# Patient Record
Sex: Male | Born: 1965 | Race: White
Health system: Northeastern US, Academic
[De-identification: ages and names within clinical notes are randomized; demographics above are authoritative.]

## PROBLEM LIST (undated history)

## (undated) DIAGNOSIS — R739 Hyperglycemia, unspecified: Secondary | ICD-10-CM

## (undated) DIAGNOSIS — IMO0001 Reserved for inherently not codable concepts without codable children: Secondary | ICD-10-CM

## (undated) DIAGNOSIS — F32A Depression, unspecified: Secondary | ICD-10-CM

## (undated) DIAGNOSIS — Z87898 Personal history of other specified conditions: Secondary | ICD-10-CM

## (undated) DIAGNOSIS — E785 Hyperlipidemia, unspecified: Secondary | ICD-10-CM

## (undated) DIAGNOSIS — F1020 Alcohol dependence, uncomplicated: Secondary | ICD-10-CM

## (undated) DIAGNOSIS — K219 Gastro-esophageal reflux disease without esophagitis: Secondary | ICD-10-CM

## (undated) DIAGNOSIS — B009 Herpesviral infection, unspecified: Secondary | ICD-10-CM

## (undated) DIAGNOSIS — F419 Anxiety disorder, unspecified: Secondary | ICD-10-CM

## (undated) DIAGNOSIS — F329 Major depressive disorder, single episode, unspecified: Secondary | ICD-10-CM

## (undated) DIAGNOSIS — N419 Inflammatory disease of prostate, unspecified: Secondary | ICD-10-CM

## (undated) DIAGNOSIS — I10 Essential (primary) hypertension: Secondary | ICD-10-CM

## (undated) HISTORY — DX: Depression, unspecified: F32.A

## (undated) HISTORY — DX: Major depressive disorder, single episode, unspecified: F32.9

## (undated) HISTORY — DX: Essential (primary) hypertension: I10

## (undated) HISTORY — DX: Hyperlipidemia, unspecified: E78.5

## (undated) HISTORY — DX: Inflammatory disease of prostate, unspecified: N41.9

## (undated) HISTORY — DX: Reserved for inherently not codable concepts without codable children: IMO0001

## (undated) HISTORY — DX: Gastro-esophageal reflux disease without esophagitis: K21.9

## (undated) HISTORY — DX: Alcohol dependence, uncomplicated: F10.20

## (undated) HISTORY — DX: Herpesviral infection, unspecified: B00.9

## (undated) HISTORY — DX: Hyperglycemia, unspecified: R73.9

## (undated) HISTORY — DX: Anxiety disorder, unspecified: F41.9

## (undated) HISTORY — PX: CHOLECYSTECTOMY: SHX55

## (undated) HISTORY — PX: COLONOSCOPY: SHX174

---
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
## 1988-05-30 HISTORY — PX: CHOLECYSTECTOMY: SHX55

## 2003-09-18 ENCOUNTER — Emergency Department (HOSPITAL_COMMUNITY): Admission: EM | Admit: 2003-09-18 | Discharge: 2003-09-18 | Payer: Self-pay | Admitting: Emergency Medicine

## 2004-05-30 HISTORY — PX: CARDIAC CATHETERIZATION: SHX172

## 2004-09-15 ENCOUNTER — Emergency Department (HOSPITAL_COMMUNITY): Admission: EM | Admit: 2004-09-15 | Discharge: 2004-09-15 | Payer: Self-pay | Admitting: Emergency Medicine

## 2004-09-16 ENCOUNTER — Inpatient Hospital Stay (HOSPITAL_COMMUNITY): Admission: EM | Admit: 2004-09-16 | Discharge: 2004-09-18 | Payer: Self-pay | Admitting: Emergency Medicine

## 2005-11-27 HISTORY — PX: DOPPLER ECHOCARDIOGRAPHY: SHX263

## 2007-05-23 ENCOUNTER — Encounter: Admission: RE | Admit: 2007-05-23 | Discharge: 2007-05-23 | Payer: Self-pay | Admitting: Neurology

## 2007-05-31 HISTORY — PX: ORIF FINGER FRACTURE: SHX2122

## 2007-08-12 ENCOUNTER — Encounter: Admission: RE | Admit: 2007-08-12 | Discharge: 2007-08-12 | Payer: Self-pay | Admitting: Orthopedic Surgery

## 2007-08-21 ENCOUNTER — Ambulatory Visit (HOSPITAL_BASED_OUTPATIENT_CLINIC_OR_DEPARTMENT_OTHER): Admission: RE | Admit: 2007-08-21 | Discharge: 2007-08-21 | Payer: Self-pay | Admitting: Orthopedic Surgery

## 2008-04-22 ENCOUNTER — Emergency Department (HOSPITAL_COMMUNITY): Admission: EM | Admit: 2008-04-22 | Discharge: 2008-04-22 | Payer: Self-pay | Admitting: Emergency Medicine

## 2009-05-04 ENCOUNTER — Emergency Department (HOSPITAL_COMMUNITY): Admission: EM | Admit: 2009-05-04 | Discharge: 2009-05-04 | Payer: Self-pay | Admitting: Emergency Medicine

## 2009-11-05 ENCOUNTER — Emergency Department (HOSPITAL_COMMUNITY): Admission: EM | Admit: 2009-11-05 | Discharge: 2009-11-05 | Payer: Self-pay | Admitting: Emergency Medicine

## 2009-12-24 ENCOUNTER — Emergency Department (HOSPITAL_COMMUNITY): Admission: EM | Admit: 2009-12-24 | Discharge: 2009-12-24 | Payer: Self-pay | Admitting: Emergency Medicine

## 2010-01-07 ENCOUNTER — Encounter
Admission: RE | Admit: 2010-01-07 | Discharge: 2010-01-07 | Payer: Self-pay | Source: Home / Self Care | Admitting: Emergency Medicine

## 2010-08-11 IMAGING — US US SCROTUM
1 series · 14 of 25 positions shown · non-contrast
Comparison: None.

CLINICAL DATA: Scrotal pain

SCROTAL ULTRASOUND
DOPPLER ULTRASOUND OF THE TESTICLES
TECHNIQUE: Complete ultrasound examination of the testicles,
epididymis, and other scrotal structures was performed.  Color and
spectral Doppler ultrasound were also utilized to evaluate blood
flow to the testicles.

[Series 1: us scrotum · 0.08mm/px · 14 of 62 slices shown]
[im 1/62]
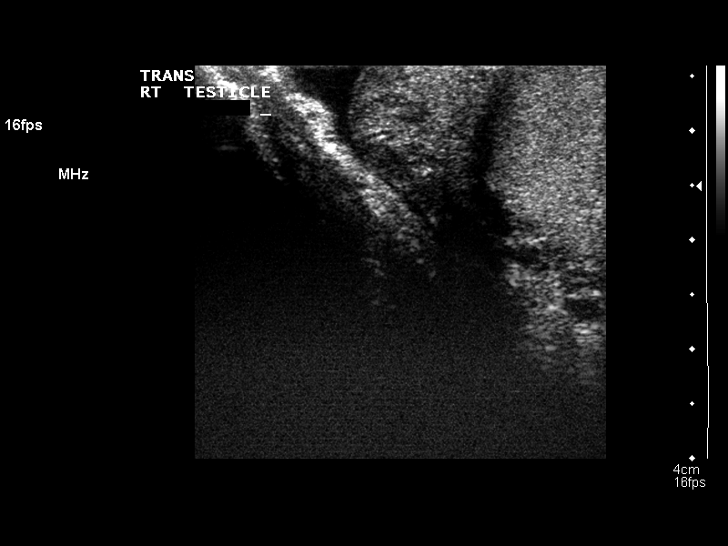
[im 6/62]
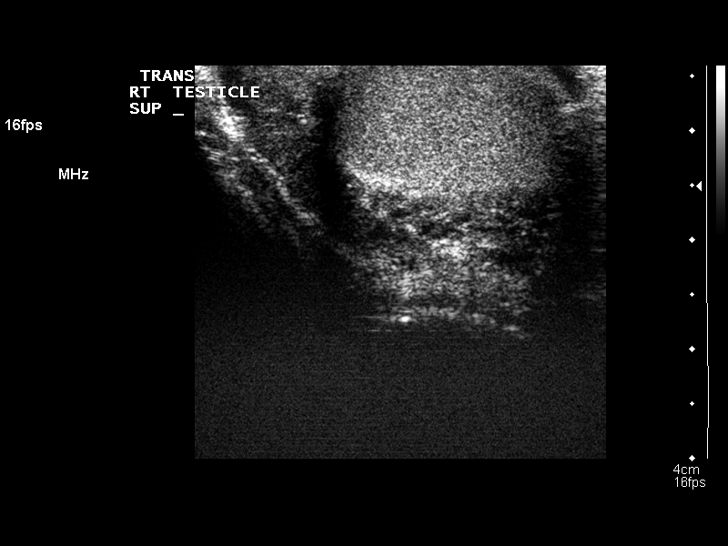
[im 11/62]
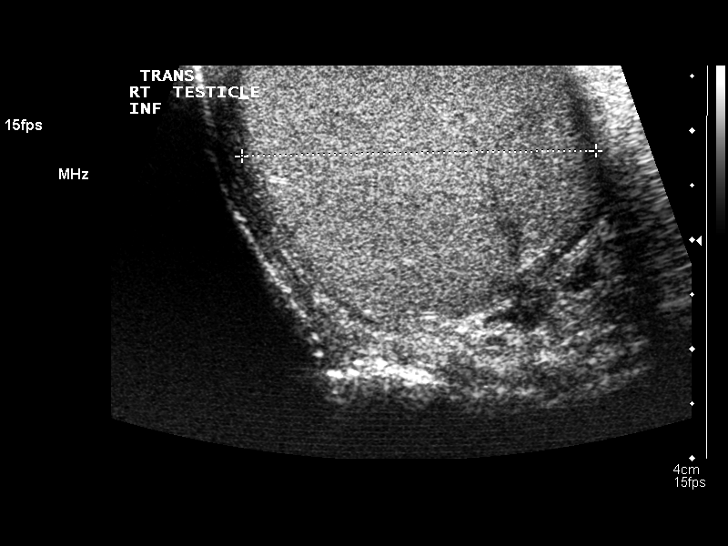
[im 16/62]
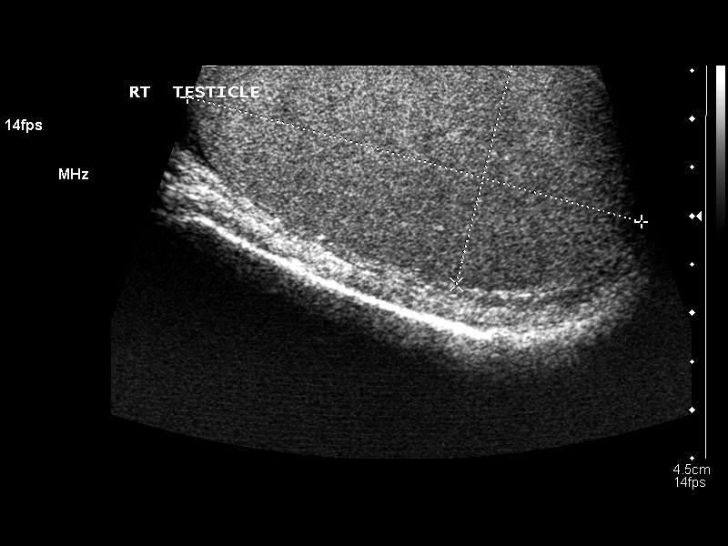
[im 21/62]
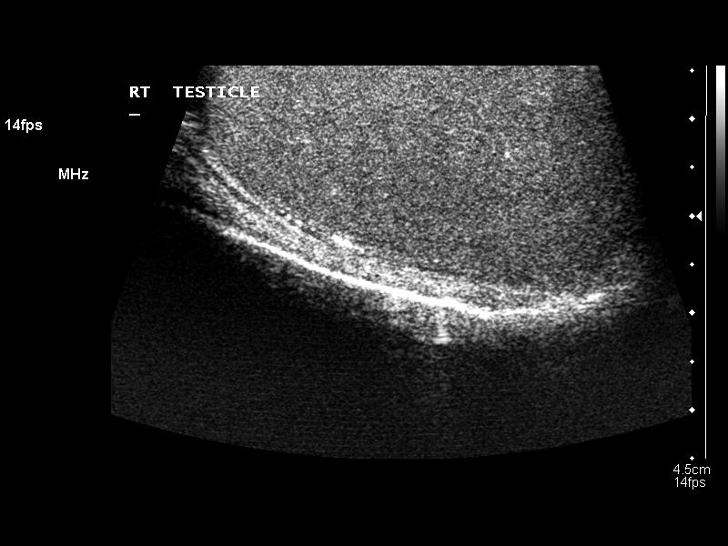
[im 23/62]
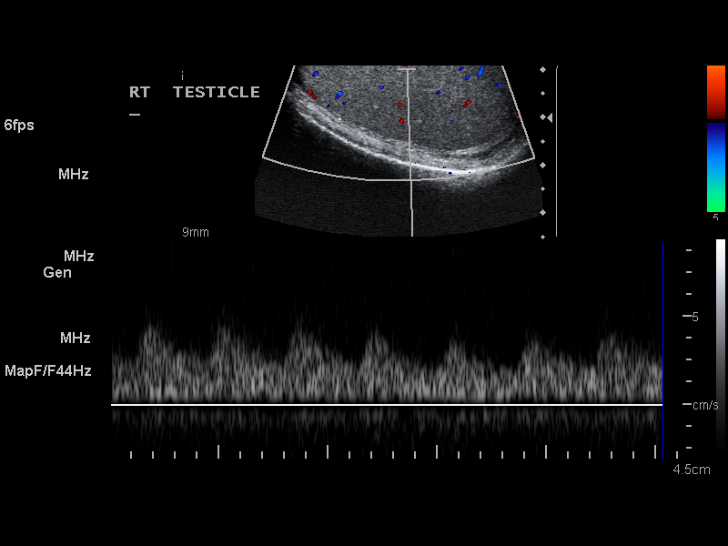
[im 28/62]
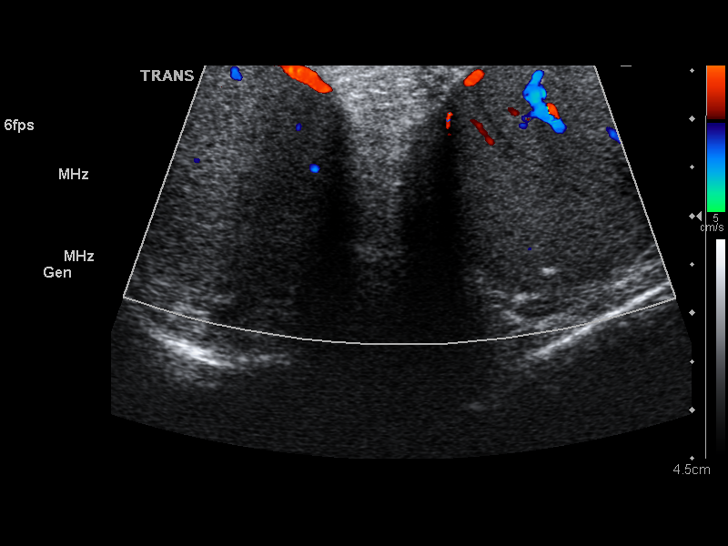
[im 34/62]
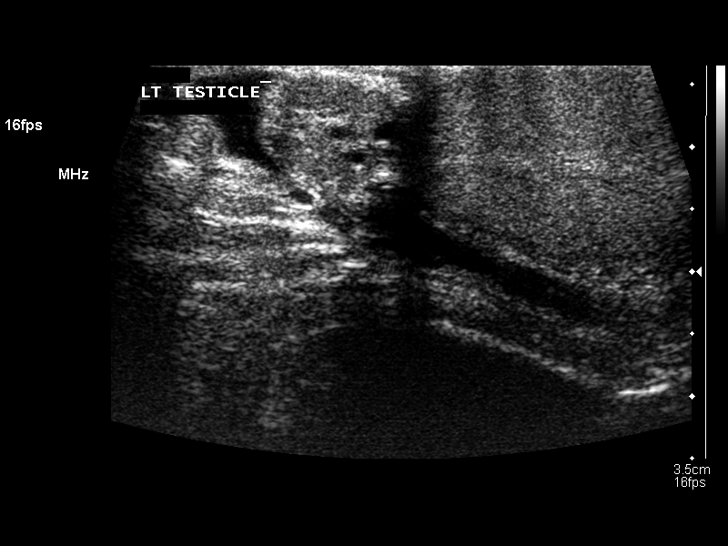
[im 39/62]
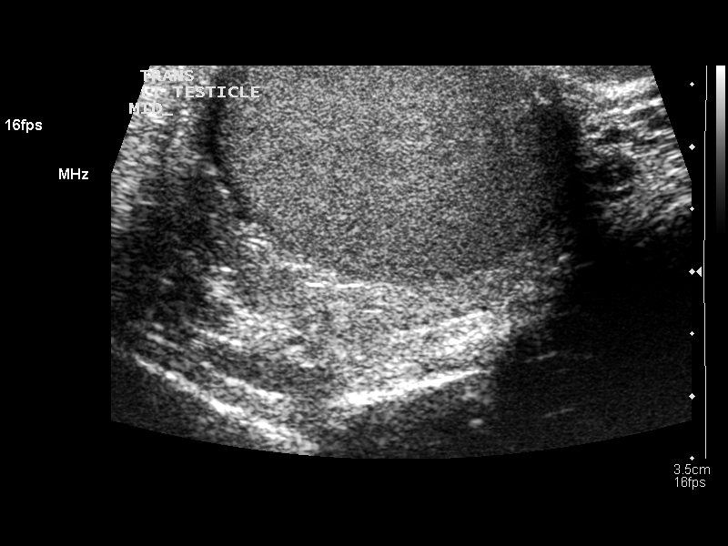
[im 41/62]
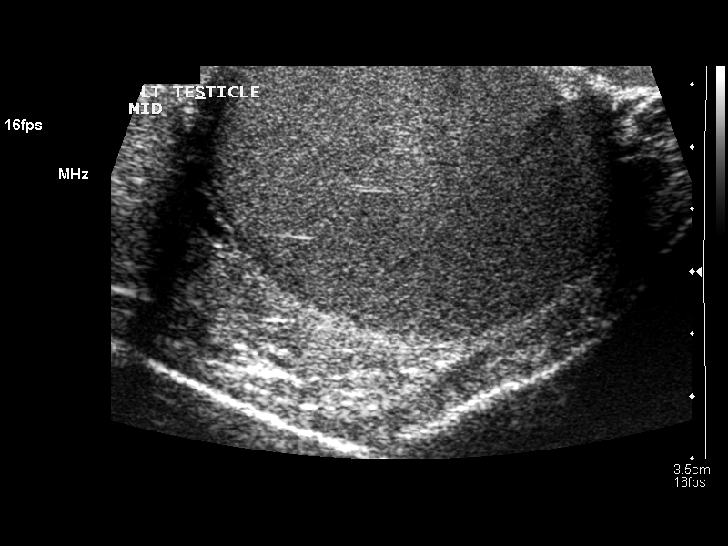
[im 46/62]
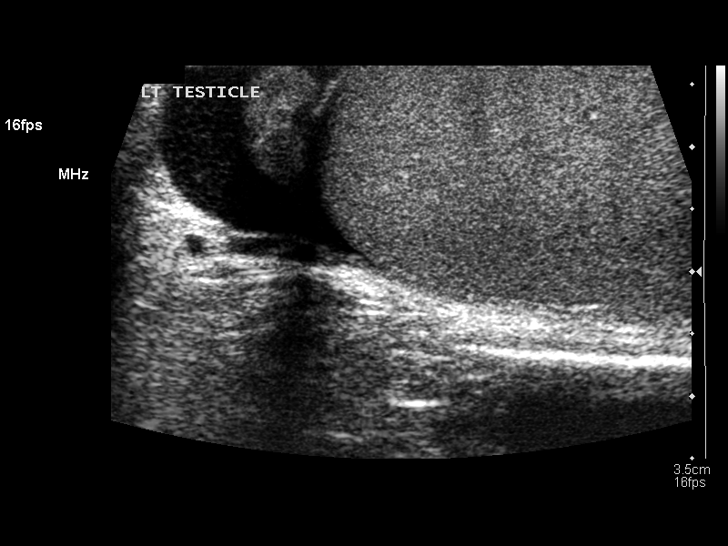
[im 51/62]
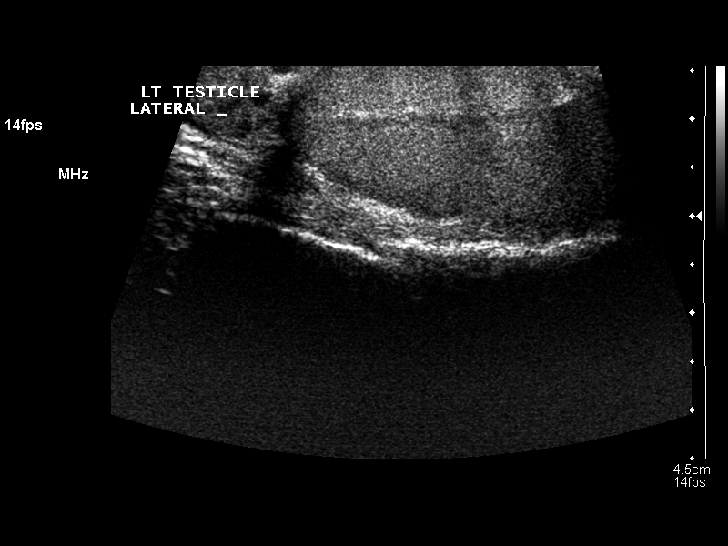
[im 56/62]
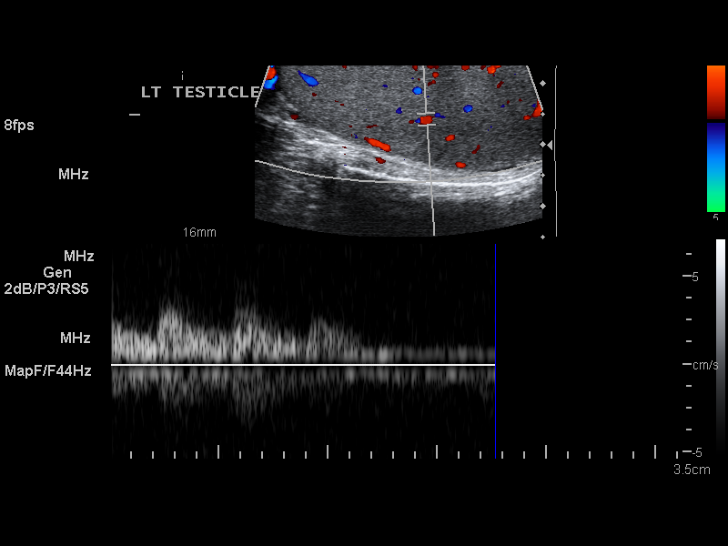
[im 62/62]
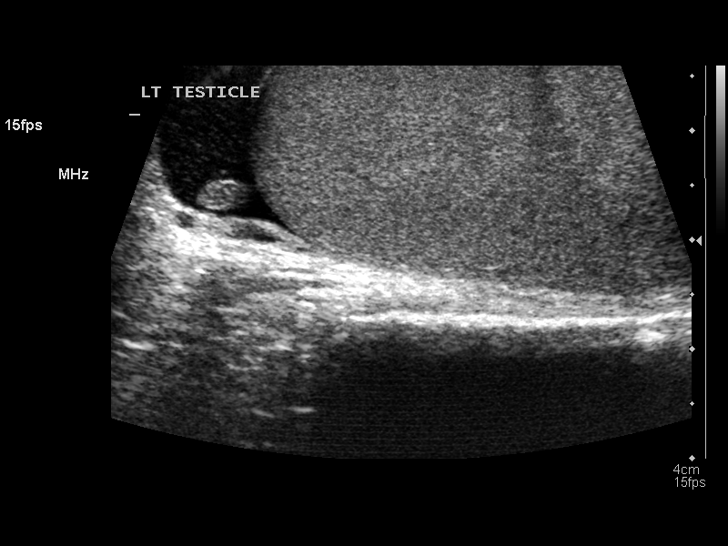

[14 of 25 positions shown; findings below may reference images not displayed]

FINDINGS: The testicles are normal in size and in echogenicity.
The right testicle measures 4.9 x 2.5 x 3.2 cm.  The left testicle
measures 4.9 x 2.4 x 3.1 cm. A few scattered calcifications are
noted bilaterally.  There is blood flow noted to both testicles
with arterial and venous waveforms present.  The right epididymis
is normal.  A small 2 mm left epididymal cyst is noted.  No
hydrocele or varicocele is seen.
IMPRESSION: No intratesticular abnormality.  There is blood flow to both
testicles.  2 mm left epididymal cyst is noted.

## 2010-08-14 LAB — URINALYSIS, ROUTINE W REFLEX MICROSCOPIC
Bilirubin Urine: NEGATIVE
Hgb urine dipstick: NEGATIVE
Ketones, ur: NEGATIVE mg/dL
Nitrite: NEGATIVE
Specific Gravity, Urine: 1.015 (ref 1.005–1.030)
Urobilinogen, UA: 0.2 mg/dL (ref 0.0–1.0)
pH: 7.5 (ref 5.0–8.0)

## 2010-08-16 LAB — HEPATIC FUNCTION PANEL
AST: 35 U/L (ref 0–37)
Albumin: 4.5 g/dL (ref 3.5–5.2)
Alkaline Phosphatase: 59 U/L (ref 39–117)
Indirect Bilirubin: 0.9 mg/dL (ref 0.3–0.9)
Total Protein: 6.9 g/dL (ref 6.0–8.3)

## 2010-08-16 LAB — CBC
HCT: 41.7 % (ref 39.0–52.0)
Hemoglobin: 14.6 g/dL (ref 13.0–17.0)
MCHC: 35 g/dL (ref 30.0–36.0)
Platelets: 167 10*3/uL (ref 150–400)
RDW: 12.8 % (ref 11.5–15.5)

## 2010-08-16 LAB — POCT I-STAT, CHEM 8
Chloride: 105 mEq/L (ref 96–112)
HCT: 43 % (ref 39.0–52.0)
Hemoglobin: 14.6 g/dL (ref 13.0–17.0)
Potassium: 4 mEq/L (ref 3.5–5.1)
Sodium: 141 mEq/L (ref 135–145)

## 2010-08-16 LAB — DIFFERENTIAL
Basophils Relative: 0 % (ref 0–1)
Eosinophils Absolute: 0 10*3/uL (ref 0.0–0.7)
Eosinophils Relative: 1 % (ref 0–5)
Neutrophils Relative %: 66 % (ref 43–77)

## 2010-08-16 LAB — POCT CARDIAC MARKERS: Troponin i, poc: <0.05 ng/mL (ref 0.00–0.09)

## 2010-08-31 LAB — DIFFERENTIAL
Basophils Absolute: 0 10*3/uL (ref 0.0–0.1)
Basophils Relative: 0 % (ref 0–1)
Eosinophils Relative: 1 % (ref 0–5)
Lymphs Abs: 1 10*3/uL (ref 0.7–4.0)
Monocytes Relative: 8 % (ref 3–12)

## 2010-08-31 LAB — CBC
HCT: 45.2 % (ref 39.0–52.0)
Hemoglobin: 15.9 g/dL (ref 13.0–17.0)
MCHC: 35.2 g/dL (ref 30.0–36.0)
MCV: 90.9 fL (ref 78.0–100.0)
Platelets: 209 10*3/uL (ref 150–400)

## 2010-08-31 LAB — POCT I-STAT, CHEM 8
BUN: 14 mg/dL (ref 6–23)
HCT: 46 % (ref 39.0–52.0)
TCO2: 31 mmol/L (ref 0–100)

## 2010-08-31 LAB — BASIC METABOLIC PANEL
CO2: 30 mEq/L (ref 19–32)
Calcium: 10.1 mg/dL (ref 8.4–10.5)
Chloride: 102 mEq/L (ref 96–112)
GFR calc Af Amer: >60 mL/min (ref >60)
GFR calc non Af Amer: >60 mL/min (ref >60)
Potassium: 3.8 mEq/L (ref 3.5–5.1)
Sodium: 140 mEq/L (ref 135–145)

## 2010-08-31 LAB — POCT CARDIAC MARKERS: Troponin i, poc: <0.05 ng/mL (ref 0.00–0.09)

## 2010-10-12 NOTE — Op Note (Signed)
NAMESEYDOU, HEARNS             ACCOUNT NO.:  [id number]   MEDICAL RECORD NO.:  [id number]          PATIENT TYPE:  AMB   LOCATION:  DSC                          FACILITY:  [facility]   PHYSICIAN:  Katy Fitch. Sypher, M.D. DATE OF BIRTH:  1966-04-08   DATE OF PROCEDURE:  08/21/2007  DATE OF DISCHARGE:                               OPERATIVE REPORT   PREOPERATIVE DIAGNOSIS:  Unstable displaced proximal metaphysis fracture  right ring finger metacarpal.   POSTOPERATIVE DIAGNOSIS:  Unstable displaced proximal metaphysis  fracture right ring finger metacarpal.   OPERATIONS:  Open reduction internal fixation of right ring finger  metacarpal utilizing 3 x 2 mm lag screws.   OPERATION SURGEON:  Josephine Igo, MD   ASSISTANT:  Annye Rusk PA-C   ANESTHESIA:  General by LMA.   SUPERVISING ANESTHESIOLOGIST:  Dr. Jairo Ben.   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   He had sustained an injury over the past weekend and was seen in our  office on Monday, August 20, 2007.  He was noted have an unstable  displaced fracture that was displaced proximally and with significant  apex dorsal angulation.  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  After  hearing the potential risks and benefits of each choice he elected to  proceed with open reduction internal fixation with lag screws.   The goal of this technique is to obtain and maintain an anatomic  reduction while he heals and to allow early active range of motion  exercises.   Mr. Kistler also has a rupture of his common extensor origin on the  left and was contemplating  reconstruction of the left common extensor  origin in the near term as well.   After informed consent he is brought to the operating room at this time.   PROCEDURE:  Fahim Kats was brought to the operating room and placed  in the supine position on the operating room table.  Following the  induction of general anesthesia by LMA technique the right arm was  prepped with Betadine soap solution and sterilely draped.  One gram of  Ancef was administered as an IV prophylactic antibiotic.  Upon  exsanguination of the right arm with Esmarch bandage the arterial  tourniquet was inflated to 230 mmHg.  The procedure commenced with a  longitudinal incision directly over the metacarpal base.  The  subcutaneous tissues were carefully divided taking care to identify and  protect the carpometacarpal joint capsule.  The periosteum was elevated  over the fracture site.  The fracture site was opened in shotgun style  and a micro curette used to remove clot and periosteal debris. The  fracture was then reduced anatomically while three 2 mm ASIF screws were  placed in lag  technique.  The screw lengths were 11 mm, 9-1/2 mm and 9  mm.  The central screw was custom tailored to a 9-1/2-mm length using  fluoroscopic control.   Anatomic reduction of the fracture was achieved.  AP, lateral, and  oblique C-arm images confirmed reduction of the fracture anatomically  and correct screw length.   The wound was then irrigated followed by repair of the muscle and  periosteum with mattress suture of 4-0 Vicryl followed by repair of the  skin with subdermal sutures of 4-0 Vicryl and intradermal 3-0 Prolene.   Mr. Skillman was placed in a compressive dressing with his long, ring  and small fingers in safe position.   For aftercare he is provided prescriptions for Percocet 5 mg, one to two  tablets p.o. q.4-6h. p.r.n. pain, 30 tablets without refill.   He will continue with Keflex 500 mg one p.o. q.8h. x4 days  as a  prophylactic antibiotic.      Katy Fitch Sypher, M.D.  Electronically Signed     RVS/MEDQ  D:  08/21/2007  T:  08/21/2007  Job:  [id number]   cc:   Katy Fitch. Sypher, M.D.

## 2010-10-15 NOTE — H&P (Signed)
NAMEMORAD, TAL             ACCOUNT NO.:  [id number]   MEDICAL RECORD NO.:  [id number]          PATIENT TYPE:  INP   LOCATION:                               FACILITY:  [facility]   PHYSICIAN:  Madaline Savage, M.D.DATE OF BIRTH:  02-02-1966   DATE OF ADMISSION:  09/16/2004  DATE OF DISCHARGE:                                HISTORY & PHYSICAL   CHIEF COMPLAINT:  Chest pain.   HISTORY OF PRESENT ILLNESS:  Patient is a 45 year old male with a history of  a cold.  He went to the urgent care on 09/15/2004.  His EKG was normal.  He  was sent to [facility] Emergency Room and his troponins were negative x2  and he was sent home.  Last evening, while watching the [place] game on TV  he developed substernal chest pain like someone sitting on my chest.  He  had some associated diaphoresis and apparent syncope.  There was a  Cardiolite done that was abnormal, the day of admission, with some inferior  thinning.  He was seen in the office today for further evaluation. He did  have a catheterization 10 years ago, that was apparently normal in [place].  He has a history of hyperlipidemia which is not treated and a history of  hypertension.  He has had panic attacks in the past and is treated with  Celexa.   CURRENT MEDICATIONS:  1.  HCTZ 25 mg a day.  2.  Celexa 20 mg a day.   ALLERGIES:  He has no known drug allergies.   SOCIAL HISTORY:  He is a remote smoker.  He is married.  He works as an  Personnel officer.  He has no children.   FAMILY HISTORY:  Remarkable for coronary disease.  His father had a bypass  at age 69.   REVIEW OF SYSTEMS:  Remarkable for taking several over-the-counter  medications for URI.  He denies any palpitations. He has not had fever or  hemoptysis.  He has no radiation of his pain to his jaw or his arms.   PHYSICAL EXAMINATION:  VITAL SIGNS:  Blood pressure 114/80, pulse 88,  respirations 16.  GENERAL:  He is a well-developed, well-nourished male in no acute  distress.  HEENT:  Normocephalic.  Extraocular movements are intact.  Sclerae are  anicteric.  Lids and conjunctivae are within normal limits.  NECK:  Without bruits, without JVD.  CHEST:  Clear to auscultation and percussion.  CARDIAC EXAM:  Reveals regular rate and rhythm without murmur, rub, or  gallop.  Normal S1-S2.  ABDOMEN:  Nontender.  No hepatosplenomegaly.  EXTREMITIES:  Without edema.  Pulses are 2+/4.  There is calf pain or  swelling.  NEUROLOGIC EXAM:  Grossly intact.   IMPRESSION:  1.  Chest pain with an abnormal Cardiolite, rule out coronary disease.  2.  Syncope of undetermined etiology.  3.  Hypertension.  4.  Untreated lipids.  5.  Family history of coronary disease.  6.  Former smoker.  7.  Upper respiratory infection.   PLAN:  He will be admitted for diagnostic catheterization.  If this is  negative, he will need a spiral CT to rule out pulmonary embolism.  The  patient was seen by Dr. Elsie Lincoln and myself in the office the day of  admission.      Antonieta Loveless  D:  11/25/2004  T:  11/25/2004  Job:  [id number]

## 2010-10-15 NOTE — Discharge Summary (Signed)
NAMESTACEY, MAURA             ACCOUNT NO.:  [id number]   MEDICAL RECORD NO.:  [id number]          PATIENT TYPE:  INP   LOCATION:  4733                         FACILITY:  [facility]   PHYSICIAN:  Madaline Savage, M.D.DATE OF BIRTH:  Nov 27, 1965   DATE OF ADMISSION:  09/16/2004  DATE OF DISCHARGE:  09/18/2004                                 DISCHARGE SUMMARY   DISCHARGE DIAGNOSES:  1.  Chest pain, consistent with unstable angina, normal coronaries and      normal left ventricular function by catheterization this admission.  2.  Hypertension.  3.  History of hyperlipidemia.   HOSPITAL COURSE:  The patient is a 45 year old male who is admitted from the  office with chest pain and an abnormal Cardiolite as an outpatient.  He had  a CT scan on admission that showed no evidence of pulmonary embolism or  dissection.  The next day, he had a diagnostic catheterization which  revealed normal coronaries and normal LV function.  He was discharged later  that day and will follow up with Dr. Elsie Lincoln.  The patient did have multiple  risk factors for coronary disease.   DISCHARGE MEDICATIONS:  1.  HCTZ 25 mg daily.  2.  Celexa 20 mg daily.   LABORATORY DATA:  White count 4.8, hemoglobin 14.4, hematocrit 40.3,  platelets 184.  INR 1.  Sodium 139, potassium 3.8, BUN 15, creatinine 0.9.  AST is 44, ALT 46.  CK-MB and troponin's are negative x3.  Lipid profile  shows a LDL of 142, HDL of 35.  TSH 2.74.   DISPOSITION:  The patient is discharged in stable condition.  Will follow up  with Dr. Elsie Lincoln.  Will need further evaluation of his lipids and liver  function tests as an outpatient.      Antonieta Loveless  D:  11/25/2004  T:  11/25/2004  Job:  [id number]

## 2010-10-15 NOTE — Cardiovascular Report (Signed)
NAMEBREON, Jeremiah [name]             ACCOUNT NO.:  [id number]   MEDICAL RECORD NO.:  [id number]          PATIENT TYPE:  INP   LOCATION:  4733                         FACILITY:  [facility]   PHYSICIAN:  Madaline Savage, M.D.DATE OF BIRTH:  [birth year]   DATE OF PROCEDURE:  09/17/2004  DATE OF DISCHARGE:  09/18/2004                              CARDIAC CATHETERIZATION   PROCEDURES PERFORMED:  1.  Selective coronary angiography by Judkins technique.  2.  Retrograde left heart catheterization.  3.  Left ventricular angiography.  4.  Right femoral artery angiogram with successful AngioSeal closure.   COMPLICATIONS:  None.   ENTRY SITE:  Right femoral.   DYE USED:  Omnipaque.   PATIENT PROFILE:  The patient is a pleasant 45 year old white married  gentleman, who had an episode of syncope on the day prior to admission with  some substernal chest pain. A Cardiolite stress test was performed at the  [facility] yesterday, and had equivocal results  with some suspicion of either attenuation are inferior wall ischemia of the  inferior wall. The patient was admitted and has had negative cardiac enzyme  determinations; now presents to the catheterization lab for cardiac  catheterization, (especially since the patient had fleeting chest pains last  evening).  He also has multiform PVCs noted. The patient enters the  catheterization  lab with stable vital signs electively.   RESULTS:   PRESSURES:  1.  Left ventricular pressure was 130/6 with an end-diastolic pressure of      16.  2.  Central aortic pressure 130/75 with a mean of 100.  3.  No aortic valve gradient by pullback technique.   ANGIOGRAPHIC RESULTS:  The patient's coronary arteries were angiographically  patent.  LEFT MAIN:  The left main was normal.  LEFT ANTERIOR DESCENDING ARTERY  The LAD coursed to cardiac apex giving  rise to two diagonal branches, both of which were normal, as was the LAD.  CIRCUMFLEX:  The circumflex was comprised of a large obtuse marginal branch,  which trifurcated and showed no lesions. The proximal mid and distal  circumflex was normal.  RIGHT CORONARY ARTERY:  The right coronary artery was widely patent  throughout and was a large and dominant vessel of the circulation, with no  lesions.   VENTRICULOGRAPHY:  The left ventricle showed good contractility of all wall  segments, without any wall motion abnormalities. Ejection fraction was 60-  65%. No evidence of LV thrombus or mitral prolapse.   ABDOMINAL AORTOGRAM:  Right femoral angiography was followed by successful  AngioSeal closure of the right femoral artery, with good hemostasis.   FINAL DIAGNOSIS:  1.  Angiographically patent coronary arteries.  2.  Normal LV systolic function.  3.  Successful Angio-Seal of the right femoral artery.   PLAN:  The patient will be seen in the office to reevaluate his syncopal  episode. 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 We  will discharge him from the hospital if he is  stable in the next 2 hours.      WHG/MEDQ  D:  09/17/2004  T:  09/18/2004  Job:  [id number]   cc:   Brett Canales A. Cleta Alberts, M.D.  [address]  [place]  [place] [address]  Fax: [phone]

## 2011-02-21 LAB — BASIC METABOLIC PANEL
Chloride: 102
Creatinine, Ser: 0.8
GFR calc Af Amer: >60
GFR calc non Af Amer: >60

## 2011-02-21 LAB — POCT HEMOGLOBIN-HEMACUE: Hemoglobin: 15

## 2011-03-01 LAB — POCT I-STAT, CHEM 8
Calcium, Ion: 1.12
Creatinine, Ser: 1
Glucose, Bld: 110 — ABNORMAL HIGH
HCT: 43
Hemoglobin: 14.6

## 2011-03-01 LAB — POCT CARDIAC MARKERS
CKMB, poc: 1.5
Myoglobin, poc: 71.3
Troponin i, poc: <0.05

## 2011-03-01 LAB — DIFFERENTIAL
Eosinophils Relative: 1
Lymphocytes Relative: 26
Lymphs Abs: 1.2
Neutro Abs: 2.9
Neutrophils Relative %: 66

## 2011-03-01 LAB — CBC
HCT: 42.3
Platelets: 196
WBC: 4.5

## 2011-03-01 LAB — RAPID URINE DRUG SCREEN, HOSP PERFORMED
Amphetamines: NOT DETECTED
Barbiturates: NOT DETECTED
Benzodiazepines: POSITIVE — AB
Cocaine: NOT DETECTED
Opiates: NOT DETECTED

## 2011-07-01 HISTORY — PX: CARDIOVASCULAR STRESS TEST: SHX262

## 2011-07-03 ENCOUNTER — Ambulatory Visit (INDEPENDENT_AMBULATORY_CARE_PROVIDER_SITE_OTHER): Payer: BC Managed Care – PPO | Admitting: Emergency Medicine

## 2011-07-03 ENCOUNTER — Encounter: Payer: Self-pay | Admitting: Emergency Medicine

## 2011-07-03 ENCOUNTER — Telehealth: Payer: Self-pay

## 2011-07-03 ENCOUNTER — Emergency Department (HOSPITAL_COMMUNITY): Payer: BC Managed Care – PPO

## 2011-07-03 ENCOUNTER — Emergency Department (HOSPITAL_COMMUNITY)
Admission: EM | Admit: 2011-07-03 | Discharge: 2011-07-03 | Disposition: A | Discharge status: Home / Self Care | Payer: BC Managed Care – PPO | Attending: Emergency Medicine | Admitting: Emergency Medicine

## 2011-07-03 ENCOUNTER — Other Ambulatory Visit: Payer: Self-pay

## 2011-07-03 DIAGNOSIS — I10 Essential (primary) hypertension: Secondary | ICD-10-CM

## 2011-07-03 DIAGNOSIS — R10819 Abdominal tenderness, unspecified site: Secondary | ICD-10-CM | POA: Insufficient documentation

## 2011-07-03 DIAGNOSIS — R0789 Other chest pain: Secondary | ICD-10-CM

## 2011-07-03 DIAGNOSIS — R079 Chest pain, unspecified: Secondary | ICD-10-CM | POA: Insufficient documentation

## 2011-07-03 DIAGNOSIS — Z79899 Other long term (current) drug therapy: Secondary | ICD-10-CM | POA: Insufficient documentation

## 2011-07-03 DIAGNOSIS — E7800 Pure hypercholesterolemia, unspecified: Secondary | ICD-10-CM

## 2011-07-03 DIAGNOSIS — R110 Nausea: Secondary | ICD-10-CM | POA: Insufficient documentation

## 2011-07-03 DIAGNOSIS — E785 Hyperlipidemia, unspecified: Secondary | ICD-10-CM | POA: Insufficient documentation

## 2011-07-03 LAB — COMPREHENSIVE METABOLIC PANEL
Albumin: 4.3 g/dL (ref 3.5–5.2)
Alkaline Phosphatase: 57 U/L (ref 39–117)
BUN: 14 mg/dL (ref 6–23)
Calcium: 9.5 mg/dL (ref 8.4–10.5)
GFR calc Af Amer: >90 mL/min (ref >90)
Glucose, Bld: 108 mg/dL — ABNORMAL HIGH (ref 70–99)
Potassium: 3.2 mEq/L — ABNORMAL LOW (ref 3.5–5.1)
Total Protein: 6.9 g/dL (ref 6.0–8.3)

## 2011-07-03 LAB — CBC
Hemoglobin: 13.4 g/dL (ref 13.0–17.0)
MCHC: 35.5 g/dL (ref 30.0–36.0)
Platelets: 165 10*3/uL (ref 150–400)
RBC: 4.33 MIL/uL (ref 4.22–5.81)

## 2011-07-03 LAB — DIFFERENTIAL
Basophils Relative: 1 % (ref 0–1)
Eosinophils Absolute: 0 10*3/uL (ref 0.0–0.7)
Lymphs Abs: 1.5 10*3/uL (ref 0.7–4.0)
Monocytes Relative: 9 % (ref 3–12)
Neutro Abs: 3.5 10*3/uL (ref 1.7–7.7)
Neutrophils Relative %: 64 % (ref 43–77)

## 2011-07-03 LAB — CARDIAC PANEL(CRET KIN+CKTOT+MB+TROPI)
CK, MB: 4.2 ng/mL — ABNORMAL HIGH (ref 0.3–4.0)
Total CK: 1047 U/L — ABNORMAL HIGH (ref 7–232)
Troponin I: <0.3 ng/mL (ref <0.30)

## 2011-07-03 MED ORDER — GI COCKTAIL ~~LOC~~
ORAL | Status: AC
  Administered 2011-07-03: 30 mL
  Filled 2011-07-03: qty 30

## 2011-07-03 MED ORDER — ASPIRIN 81 MG PO CHEW
324.0000 mg | CHEWABLE_TABLET | Freq: Once | ORAL | Status: AC
  Administered 2011-07-03: 324 mg via ORAL

## 2011-07-03 MED ORDER — SODIUM CHLORIDE 0.9 % IV SOLN: INTRAVENOUS | Status: DC

## 2011-07-03 NOTE — Progress Notes (Signed)
  Subjective:    Patient ID: Jeremiah [name], male    DOB: July 31, 1965, 46 y.o.   MRN: [id number]  HPI patient presents with onset at approximately 11 AM of severe substernal chest pain with radiation down the left arm. He was exercising on the treadmill when he experienced the pain which was associated with nausea and a feeling that he was going to faint. He did not experience true shortness of breath but felt extremely weak. Initially the pain was a 10 out of 10 but gradually subsided somewhat over the late morning and early afternoon. He has a history of previous cardiac evaluation and has undergone coronary arteriography in the past. By history that he does not have any existing coronary artery disease    Review of Systems patient denies any new problems. He denies any drug use and may perceptiveness episode of chest pain. He continues to drink fairly heavily but has not smoked for     Objective:   Physical Exam on initial exam patient did not appear to be in any immediate distress. His chest was clear his heart had a regular rate without murmur his abdomen was soft with no tenderness. His extremities did not show any calf tenderness or swelling. An emergent EKG was performed and showed a normal sinus rhythm with mild J-point elevation in V1 to V3. There were no significant changes of ST elevation.    IV access LAC  Jonathon Tan     Assessment & Plan:  Patient's initial description of his chest pain was very suspicious for coronary disease. The patient is a regular patient [facility] and sometime in the past approximately 3 years ago did undergo coronary catheterization. Initially I will the patient was tachycardic with a heart rate greater than 150 and systolic pressure right at 80. This is very suspicious that he had some left [place] PSVT a rapid atrial associate with his initial presentation of chest pain. Were not able to document this on the monitor or on his initial EKG. EMS was  called and he was transported via EMS to the ER. Charge nurse was called and she is aware of his coming by EMS. Patient was given 481 mg aspirin here in the office given O2 2 L nasal cannula monitored and sent to the ER by EMS.

## 2011-07-03 NOTE — ED Notes (Addendum)
Pt to ED via [facility], pt c/o sudden onset of mid-sternum chest tightness and nausea, pt reports he was running on the treadmill when the chest tightness started, pt drove self to [facility] , Dr. Juline Patch noted pt was pale but not diaphoretic. Pt reports to EMS he has a lot of new stressors. ASA 324 mg given at [facility] @ 1510

## 2011-07-03 NOTE — ED Notes (Signed)
Family at bedside. 

## 2011-07-03 NOTE — ED Notes (Addendum)
Pt c/o mid-sternum chest tightness radiating into L jaw and down into L arm and nausea starting 1000 this am, sob and dizziness yesterday afternoon, back pain b/w shoulder blades last night, pt denies sob, dizziness, diaphoresis, or abd pain today. Pt reports his sob and dizziness started while bending and lifting a hot water heater yesterday. Pt describes his chest tightness as a burning sensation, rates pain 0/10

## 2011-07-03 NOTE — ED Provider Notes (Signed)
History     CSN: [id number]  Arrival date & time 07/03/11  1556   First MD Initiated Contact with Patient 07/03/11 1601      Chief Complaint  Patient presents with  . Chest Pain    (Consider location/radiation/quality/duration/timing/severity/associated sxs/prior treatment) Patient is a 46 y.o. male presenting with chest pain. The history is provided by the patient.  Chest Pain The chest pain began 6 - 12 hours ago. Chest pain occurs intermittently. The chest pain is resolved. Associated with: Started while running on the treadmill. At its most intense, the pain is at 4/10. The pain is currently at 0/10. The severity of the pain is moderate. The quality of the pain is described as burning, heavy and dull. The pain does not radiate. Primary symptoms include nausea. Pertinent negatives for primary symptoms include no fever, no shortness of breath, no cough, no wheezing, no abdominal pain, no vomiting and no dizziness.  Pertinent negatives for associated symptoms include no diaphoresis, no lower extremity edema, no numbness and no weakness. He tried nothing for the symptoms. Risk factors include male gender and stress.  His past medical history is significant for hyperlipidemia and hypertension.  Pertinent negatives for past medical history include no CAD and no diabetes.  His family medical history is significant for early MI in family.  Procedure history is positive for cardiac catheterization.     No past medical history on file.  No past surgical history on file.  No family history on file.  History  Substance Use Topics  . Smoking status: Never Smoker   . Smokeless tobacco: Not on file  . Alcohol Use: Not on file      Review of Systems  Constitutional: Negative for fever and diaphoresis.  Respiratory: Negative for cough, shortness of breath and wheezing.   Cardiovascular: Positive for chest pain.  Gastrointestinal: Positive for nausea. Negative for vomiting and abdominal  pain.  Neurological: Negative for dizziness, weakness and numbness.  All other systems reviewed and are negative.    Allergies  Review of patient's allergies indicates no known allergies.  Home Medications   Current Outpatient Rx  Name Route Sig Dispense Refill  . DIAZEPAM 5 MG PO TABS Oral Take 5 mg by mouth every 6 (six) hours as needed. For chest pain    . HYDROCHLOROTHIAZIDE 25 MG PO TABS Oral Take 25 mg by mouth daily.    Marland Kitchen SIMVASTATIN 20 MG PO TABS Oral Take 20 mg by mouth every evening.      BP 141/109  Pulse 76  Temp(Src) 98.3 F (36.8 C) (Oral)  Resp 17  SpO2 99%  Physical Exam  Nursing note and vitals reviewed. Constitutional: He is oriented to person, place, and time. He appears well-developed and well-nourished. No distress.  HENT:  Head: Normocephalic and atraumatic.  Mouth/Throat: Oropharynx is clear and moist.  Eyes: Conjunctivae and EOM are normal. Pupils are equal, round, and reactive to light.  Neck: Normal range of motion. Neck supple.  Cardiovascular: Normal rate, regular rhythm and intact distal pulses.   No murmur heard. Pulmonary/Chest: Effort normal and breath sounds normal. No respiratory distress. He has no wheezes. He has no rales.  Abdominal: Soft. He exhibits no distension. There is tenderness in the right upper quadrant and left upper quadrant. There is no rebound and no guarding.       Mild tenderness in the upper abdominal quadrants  Musculoskeletal: Normal range of motion. He exhibits no edema and no tenderness.  Neurological: He  is alert and oriented to person, place, and time.  Skin: Skin is warm and dry. No rash noted. No erythema.  Psychiatric: He has a normal mood and affect. His behavior is normal.    ED Course  Procedures (including critical care time)  Labs Reviewed  CBC - Abnormal; Notable for the following:    HCT 37.7 (*)    All other components within normal limits  CARDIAC PANEL(CRET KIN+CKTOT+MB+TROPI) - Abnormal;  Notable for the following:    Total CK 1047 (*)    CK, MB 4.2 (*)    All other components within normal limits  COMPREHENSIVE METABOLIC PANEL - Abnormal; Notable for the following:    Potassium 3.2 (*)    Glucose, Bld 108 (*)    AST 47 (*)    All other components within normal limits  DIFFERENTIAL   Dg Chest Port 1 View  07/03/2011  *RADIOLOGY REPORT*  Clinical Data: Chest pain.  Hypertension.  CHEST - 1 VIEW  Comparison:  11/05/2009  Findings: The heart size and mediastinal contours are within normal limits.  Both lungs are clear.  IMPRESSION: No active disease.  Original Report Authenticated By: Danae Orleans, M.D.     Date: 07/03/2011  Rate: 70  Rhythm: normal sinus rhythm  QRS Axis: normal  Intervals: normal  ST/T Wave abnormalities: normal  Conduction Disutrbances:none  Narrative Interpretation:   Old EKG Reviewed: unchanged    No diagnosis found.    MDM   Patient with chest pain today that started while running. He said it started 10-15 minutes into his run and lasted about 35 minutes and he started to feel nauseated and stopped. The pain is been off and on since 10 AM and had resolved by noon. He had no shortness of breath or sweating with the episode. He states that he was fine until 2 when his mother called with stressful news and then he started feeling it again. He states that he gets chest pain daily and he's been admitted 5 times in the past 10 years for similar symptoms.  He had a completely clean catheterization 3 years ago. He's had several catheterizations and they have all been within normal limits.  He states he feels completely normal now. He does have multiple risk factors which make him a TIMI 1 but otherwise given the normal cath results feel that coronary artery disease is unlikely. Because the symptoms started at 10 AM this morning feel that cardiac enzymes should be elevating now this is a coronary event. His EKG is within normal limits. CBC, CMP, cardiac  enzymes, chest x-ray pending and will discuss with cardiology.  Patient does have mild tenderness in his upper abdominal quadrants however he denies any vomiting, pain with eating, and states that he's been doing multiple abdominal crunches yesterday and feels that that's why his abdomen hurts. He has no peritoneal signs and the pain is reproduced by him sitting up and laying down and feel it is muscular.  However patient states he drinks heavily daily and since his pain is more pronounced over the right upper quadrant will get a CMP to ensure her liver enzymes are within normal limits. Labs are within normal limits other than an elevated CK of about 7 and a CK-MB of 4.2. Negative troponin however feel that is due to his recent heavy working out and running.  Spoke with [facility] and they recommended that he come for an outpatient stress test on Tuesday. They're going to  call and make the appointment for him. He is not to do any more running until he gets the        Gwyneth Sprout, MD 07/03/11 1816

## 2011-07-03 NOTE — Progress Notes (Signed)
First set of vitals at 1506

## 2011-07-05 ENCOUNTER — Encounter: Payer: Self-pay | Admitting: *Deleted

## 2011-07-05 DIAGNOSIS — I10 Essential (primary) hypertension: Secondary | ICD-10-CM | POA: Insufficient documentation

## 2011-07-05 DIAGNOSIS — F329 Major depressive disorder, single episode, unspecified: Secondary | ICD-10-CM

## 2011-07-05 DIAGNOSIS — F1020 Alcohol dependence, uncomplicated: Secondary | ICD-10-CM | POA: Insufficient documentation

## 2011-07-05 DIAGNOSIS — E785 Hyperlipidemia, unspecified: Secondary | ICD-10-CM

## 2011-07-08 ENCOUNTER — Other Ambulatory Visit: Payer: Self-pay | Admitting: Family Medicine

## 2011-07-08 MED ORDER — SIMVASTATIN 20 MG PO TABS: 40.0000 mg | ORAL_TABLET | Freq: Every day | ORAL | Status: DC

## 2011-07-21 NOTE — Telephone Encounter (Signed)
Done

## 2011-07-26 ENCOUNTER — Ambulatory Visit: Payer: Self-pay | Admitting: Emergency Medicine

## 2011-08-15 ENCOUNTER — Telehealth: Payer: Self-pay

## 2011-08-15 ENCOUNTER — Ambulatory Visit (INDEPENDENT_AMBULATORY_CARE_PROVIDER_SITE_OTHER): Payer: BC Managed Care – PPO | Admitting: Family Medicine

## 2011-08-15 VITALS — BP 128/89 | HR 74 | Temp 98.7°F | Resp 16 | Ht 68.0 in | Wt 181.2 lb

## 2011-08-15 DIAGNOSIS — I10 Essential (primary) hypertension: Secondary | ICD-10-CM

## 2011-08-15 DIAGNOSIS — F411 Generalized anxiety disorder: Secondary | ICD-10-CM

## 2011-08-15 DIAGNOSIS — R748 Abnormal levels of other serum enzymes: Secondary | ICD-10-CM

## 2011-08-15 DIAGNOSIS — F419 Anxiety disorder, unspecified: Secondary | ICD-10-CM

## 2011-08-15 DIAGNOSIS — E785 Hyperlipidemia, unspecified: Secondary | ICD-10-CM

## 2011-08-15 LAB — CK: Total CK: 84 U/L (ref 7–232)

## 2011-08-15 LAB — COMPREHENSIVE METABOLIC PANEL
ALT: 24 U/L (ref 0–53)
Albumin: 4.9 g/dL (ref 3.5–5.2)
CO2: 25 mEq/L (ref 19–32)
Chloride: 103 mEq/L (ref 96–112)
Glucose, Bld: 104 mg/dL — ABNORMAL HIGH (ref 70–99)
Potassium: 4.2 mEq/L (ref 3.5–5.3)
Sodium: 141 mEq/L (ref 135–145)
Total Bilirubin: 0.7 mg/dL (ref 0.3–1.2)
Total Protein: 7.2 g/dL (ref 6.0–8.3)

## 2011-08-15 LAB — POCT CBC
Lymph, poc: 1.6 (ref 0.6–3.4)
MCH, POC: 30.4 pg (ref 27–31.2)
MCHC: 33.3 g/dL (ref 31.8–35.4)
MCV: 91.1 fL (ref 80–97)
MID (cbc): 0.4 (ref 0–0.9)
MPV: 9.3 fL (ref 0–99.8)
POC LYMPH PERCENT: 29.4 %L (ref 10–50)
POC MID %: 8 %M (ref 0–12)
Platelet Count, POC: 229 10*3/uL (ref 142–424)
RDW, POC: 13.9 %
WBC: 5.3 10*3/uL (ref 4.6–10.2)

## 2011-08-15 LAB — LIPID PANEL
HDL: 60 mg/dL (ref >39)
LDL Cholesterol: 96 mg/dL (ref 0–99)

## 2011-08-15 MED ORDER — DIAZEPAM 5 MG PO TABS: ORAL_TABLET | ORAL | Status: DC

## 2011-08-15 MED ORDER — SIMVASTATIN 40 MG PO TABS: 40.0000 mg | ORAL_TABLET | Freq: Every day | ORAL | Status: DC

## 2011-08-15 MED ORDER — LOSARTAN POTASSIUM-HCTZ 100-12.5 MG PO TABS: 1.0000 | ORAL_TABLET | Freq: Every day | ORAL | Status: DC

## 2011-08-15 MED ORDER — SIMVASTATIN 20 MG PO TABS: 40.0000 mg | ORAL_TABLET | Freq: Every day | ORAL | Status: DC

## 2011-08-15 NOTE — Progress Notes (Signed)
Subjective: This patient is here for regular visit. He needs his prescriptions refilled. He takes his blood pressure and cholesterol medicines regularly. He does not follow his blood pressure on a regular basis. He had labs done not long ago for an [employer]. He also was at the hospital once and had some various labs done there. I went back and reviewed those and the CPK was high, although it was noncardiac. He does drink some alcohol on a fairly regular basis in the evenings, and we discussed the need for moderation in that   Objective: Well-developed white male alert oriented in no acute distress. HEENT negative. Chest clear. Heart regular without murmurs. Abdomen soft without organomegaly or masses. Extremities without edema.  Assessment 1 hypertension 2 hyperlipidemia 3 anxiety  Plan per instructions. Advised him to use the Valium only when really necessary. Discussed exercise. Discussed alcohol intake.

## 2011-08-15 NOTE — Patient Instructions (Signed)
Use the Valium only when really needed.  Discussed the need for being cautious about alcohol intake.  Continue regular exercise.  Return to see Dr. Cleta Alberts about 4 months.

## 2011-08-15 NOTE — Telephone Encounter (Signed)
Pharmacist called with ? About whether simvastatin Rx was for one or two tablets daily d/t when it was sent over, it had both directions. Checked with Dr Alwyn Ren and he stated he was keeping pt on same dose as he had been. Checked pt's records and he had been on 40 mg QD and Dr Alwyn Ren gave VO for call in to Rx of simvastatin 40 mg QD #30 5 RFs. Called in Rx

## 2011-08-16 ENCOUNTER — Encounter: Payer: Self-pay | Admitting: Physician Assistant

## 2011-08-16 ENCOUNTER — Other Ambulatory Visit: Payer: Self-pay | Admitting: *Deleted

## 2011-10-05 ENCOUNTER — Ambulatory Visit: Payer: BC Managed Care – PPO | Admitting: Emergency Medicine

## 2011-10-05 VITALS — BP 138/93 | HR 76 | Temp 97.9°F | Resp 16 | Ht 68.25 in | Wt 178.8 lb

## 2011-10-05 DIAGNOSIS — M26609 Unspecified temporomandibular joint disorder, unspecified side: Secondary | ICD-10-CM

## 2011-10-05 DIAGNOSIS — M279 Disease of jaws, unspecified: Secondary | ICD-10-CM

## 2011-10-06 NOTE — Progress Notes (Signed)
  Subjective:    Patient ID: Jeremiah [name], male    DOB: 1965-08-01, 46 y.o.   MRN: [id number]  HPI patient initially treated with a bad right ear infection with antibiotics. He continued to have discomfort and went to the physician today and was told he had TMJ placed on Mobic Effexor    Review of Systems     Objective:   Physical Exam exam reveals tenderness and popping over the right TMJ. His eardrum is normal and his throat exam is normal        Assessment & Plan:  Assessment is right TMJ we'll make referral to an oral surgeon if his dentist is not able to do so.

## 2011-11-14 ENCOUNTER — Other Ambulatory Visit: Payer: Self-pay | Admitting: Emergency Medicine

## 2011-11-16 ENCOUNTER — Other Ambulatory Visit: Payer: Self-pay | Admitting: *Deleted

## 2011-11-16 MED ORDER — DIAZEPAM 5 MG PO TABS: ORAL_TABLET | ORAL | Status: DC

## 2011-12-13 ENCOUNTER — Ambulatory Visit (INDEPENDENT_AMBULATORY_CARE_PROVIDER_SITE_OTHER): Payer: BC Managed Care – PPO | Admitting: Emergency Medicine

## 2011-12-13 VITALS — BP 131/88 | HR 76 | Temp 98.2°F | Resp 16 | Ht 69.0 in | Wt 182.8 lb

## 2011-12-13 DIAGNOSIS — E785 Hyperlipidemia, unspecified: Secondary | ICD-10-CM

## 2011-12-13 DIAGNOSIS — I10 Essential (primary) hypertension: Secondary | ICD-10-CM

## 2011-12-13 DIAGNOSIS — B359 Dermatophytosis, unspecified: Secondary | ICD-10-CM

## 2011-12-13 DIAGNOSIS — F411 Generalized anxiety disorder: Secondary | ICD-10-CM

## 2011-12-13 DIAGNOSIS — R21 Rash and other nonspecific skin eruption: Secondary | ICD-10-CM

## 2011-12-13 DIAGNOSIS — F419 Anxiety disorder, unspecified: Secondary | ICD-10-CM

## 2011-12-13 LAB — CBC WITH DIFFERENTIAL/PLATELET
Basophils Absolute: 0 10*3/uL (ref 0.0–0.1)
HCT: 42.9 % (ref 39.0–52.0)
Hemoglobin: 15 g/dL (ref 13.0–17.0)
Lymphocytes Relative: 29 % (ref 12–46)
Lymphs Abs: 1.2 10*3/uL (ref 0.7–4.0)
Monocytes Absolute: 0.2 10*3/uL (ref 0.1–1.0)
Monocytes Relative: 5 % (ref 3–12)
Neutro Abs: 2.7 10*3/uL (ref 1.7–7.7)
RBC: 4.85 MIL/uL (ref 4.22–5.81)
WBC: 4.2 10*3/uL (ref 4.0–10.5)

## 2011-12-13 LAB — LIPID PANEL
HDL: 58 mg/dL (ref >39)
Total CHOL/HDL Ratio: 3 Ratio
Triglycerides: 129 mg/dL (ref <150)

## 2011-12-13 LAB — COMPREHENSIVE METABOLIC PANEL
BUN: 17 mg/dL (ref 6–23)
CO2: 29 mEq/L (ref 19–32)
Calcium: 10.3 mg/dL (ref 8.4–10.5)
Chloride: 104 mEq/L (ref 96–112)
Creat: 0.96 mg/dL (ref 0.50–1.35)
Total Bilirubin: 0.8 mg/dL (ref 0.3–1.2)

## 2011-12-13 MED ORDER — LOSARTAN POTASSIUM-HCTZ 100-12.5 MG PO TABS: 1.0000 | ORAL_TABLET | Freq: Every day | ORAL | Status: DC

## 2011-12-13 MED ORDER — DIAZEPAM 5 MG PO TABS: ORAL_TABLET | ORAL | Status: DC

## 2011-12-13 MED ORDER — SIMVASTATIN 40 MG PO TABS: 40.0000 mg | ORAL_TABLET | Freq: Every day | ORAL | Status: DC

## 2011-12-13 NOTE — Progress Notes (Signed)
  Subjective:    Patient ID: Jeremiah [name], male    DOB: [birth year], 46 y.o.   MRN: [id number]  HPI patient comes in for routine followup. He still is drinking more beer than he should but overall has been working a lot feeling pretty good. He denies chest pain or shortness of breath. The only problem recently has been noticed a rash over his left anterior chest    Review of Systems     Objective:   Physical Exam  Constitutional: He appears well-developed and well-nourished.  HENT:  Head: Normocephalic.  Eyes: Pupils are equal, round, and reactive to light.  Neck: No thyromegaly present.  Cardiovascular: Normal rate and regular rhythm.   Pulmonary/Chest: Breath sounds normal. No respiratory distress. He has no wheezes. He has no rales.  Skin:       There is a circular 3 x 3 cm raised red patch left anterior chest with advancing border.   Results for orders placed in visit on 12/13/11  POCT SKIN KOH      Component Value Range   Skin KOH, POC Positive            Assessment & Plan:  Is stable at present. No changes in medication. His valium was refilled for #30 with 5 refills. I gave him a suggestion of Lotrimin AF to use for the ringworm on his chest .

## 2011-12-15 ENCOUNTER — Telehealth: Payer: Self-pay

## 2011-12-15 NOTE — Telephone Encounter (Signed)
PT STATES WE HAD CALLED REGARDING HIS LABS. PLEASE CALL [phone]

## 2011-12-15 NOTE — Telephone Encounter (Signed)
Patient called and was notified and voiced understanding labs normal.

## 2012-01-23 ENCOUNTER — Ambulatory Visit: Payer: BC Managed Care – PPO | Admitting: Family Medicine

## 2012-01-23 VITALS — BP 118/90 | HR 88 | Temp 98.8°F | Resp 18 | Ht 69.5 in | Wt 186.0 lb

## 2012-01-23 DIAGNOSIS — J069 Acute upper respiratory infection, unspecified: Secondary | ICD-10-CM

## 2012-01-23 DIAGNOSIS — R05 Cough: Secondary | ICD-10-CM

## 2012-01-23 MED ORDER — AZITHROMYCIN 250 MG PO TABS: ORAL_TABLET | ORAL | Status: AC

## 2012-01-23 MED ORDER — IPRATROPIUM BROMIDE 0.06 % NA SOLN: 2.0000 | Freq: Four times a day (QID) | NASAL | Status: DC

## 2012-01-23 NOTE — Progress Notes (Signed)
  Subjective:    Patient ID: ADIL TUGWELL, male    DOB: [birth year], 46 y.o.   MRN: [id number]  HPI JACKEY HOUSEY is a 46 y.o. male  Sinus congestion, chest cold, and sore throatt - starting 4 days ago - thicker nasal congestion this morning.  No fever.  No hx of asthma. Slight dry cough.  No dyspnea.    2 nights ago - had some nausea - vomited 3-4 times,  Had not drank any alcohol prior. Only drinks few drinks few times per week now. No further vomiting.  May have been due to something he ate.  Feels ok now.   Leaving for [place] in few days.    TX:  otc decongestant and advil.    Few sick contacts.    Review of Systems As above.     Objective:   Physical Exam  Constitutional: He is oriented to person, place, and time. He appears well-developed and well-nourished.  HENT:  Head: Normocephalic and atraumatic.  Right Ear: Tympanic membrane, external ear and ear canal normal.  Left Ear: Tympanic membrane, external ear and ear canal normal.  Nose: Right sinus exhibits no maxillary sinus tenderness and no frontal sinus tenderness. Left sinus exhibits no maxillary sinus tenderness and no frontal sinus tenderness.  Mouth/Throat: Uvula is midline and oropharynx is clear and moist. No oropharyngeal exudate.  Eyes: EOM are normal. Pupils are equal, round, and reactive to light.  Cardiovascular: Normal rate, regular rhythm, normal heart sounds and intact distal pulses.   Pulmonary/Chest: Effort normal and breath sounds normal. No respiratory distress.  Abdominal: Soft. Bowel sounds are normal. He exhibits no distension. There is no tenderness. There is no rebound.  Lymphadenopathy:    He has no cervical adenopathy.  Neurological: He is alert and oriented to person, place, and time.  Skin: Skin is warm and dry. No rash noted.  Psychiatric: He has a normal mood and affect. His behavior is normal.          Assessment & Plan:  ETAN VASUDEVAN is a 46 y.o. male 1. URI (upper  respiratory infection)  ipratropium (ATROVENT) 0.06 % nasal spray, azithromycin (ZITHROMAX) 250 MG tablet  2. Cough  azithromycin (ZITHROMAX) 250 MG tablet    Likely viral, sx care for now.  Can fill z pak if not improving this week. rtc precautions.   Emesis - resolved. rtc if recurs.   rtc precautions.

## 2012-01-23 NOTE — Patient Instructions (Addendum)
Saline nasal spray atleast 4 times per day, atrovent nasal spray as needed, drink plenty of fluids. If not improving in few days  - can start Zpak.  Return to clinic if worsening.

## 2012-02-21 ENCOUNTER — Other Ambulatory Visit: Payer: Self-pay | Admitting: Family Medicine

## 2012-04-17 ENCOUNTER — Ambulatory Visit: Payer: BC Managed Care – PPO | Admitting: Emergency Medicine

## 2012-05-11 ENCOUNTER — Ambulatory Visit (INDEPENDENT_AMBULATORY_CARE_PROVIDER_SITE_OTHER): Payer: BC Managed Care – PPO | Admitting: Emergency Medicine

## 2012-05-11 VITALS — BP 116/86 | HR 78 | Temp 98.4°F | Resp 16 | Ht 69.75 in | Wt 188.0 lb

## 2012-05-11 DIAGNOSIS — H9209 Otalgia, unspecified ear: Secondary | ICD-10-CM

## 2012-05-11 DIAGNOSIS — J329 Chronic sinusitis, unspecified: Secondary | ICD-10-CM

## 2012-05-11 MED ORDER — AZITHROMYCIN 250 MG PO TABS: ORAL_TABLET | ORAL | Status: DC

## 2012-05-11 MED ORDER — PREDNISONE 10 MG PO TABS: ORAL_TABLET | ORAL | Status: DC

## 2012-05-11 NOTE — Progress Notes (Signed)
  Subjective:    Patient ID: Jeremiah [name], male    DOB: 1966-01-12, 46 y.o.   MRN: [id number]  HPI 46 year old male presents with ear pain in both ears. Patient flew to [facility] a few weeks ago and had ear pain when he got back. He went to a doctor and blood was in his ears. His ears are still painful.   Review of Systems     Objective:   Physical Exam TMs are dull with questionable fluid the nose is congested posterior pharynx is slightly red. Chest clear to auscultation and percussion.        Assessment & Plan:  I suspect all these symptoms started following his flight back from [facility] when he suffered barotrauma. He d not respond to initial medications. we'll treat with prednisone and Z-Pak and see if we can get him better

## 2012-05-18 ENCOUNTER — Ambulatory Visit (INDEPENDENT_AMBULATORY_CARE_PROVIDER_SITE_OTHER): Payer: BC Managed Care – PPO | Admitting: Family Medicine

## 2012-05-18 ENCOUNTER — Encounter: Payer: Self-pay | Admitting: Family Medicine

## 2012-05-18 VITALS — BP 136/85 | HR 87 | Temp 98.1°F | Resp 16 | Ht 69.0 in | Wt 186.0 lb

## 2012-05-18 DIAGNOSIS — J029 Acute pharyngitis, unspecified: Secondary | ICD-10-CM

## 2012-05-18 LAB — POCT RAPID STREP A (OFFICE): Rapid Strep A Screen: NEGATIVE

## 2012-05-18 MED ORDER — MAGIC MOUTHWASH: 10.0000 mL | Freq: Four times a day (QID) | ORAL | Status: DC | PRN

## 2012-05-18 NOTE — Patient Instructions (Addendum)

## 2012-05-18 NOTE — Progress Notes (Signed)
Seen last week with ear infection and prescribed Z pak which he finished.  Throat began to hurt 2 days ago.  Now he has an associated dry cough.  O:  Alert, cooperative, ;NAD Throat:  Very erythematous w/out swelling or exudates TM's: normal Neck: no adenop  Results for orders placed in visit on 05/18/12  POCT RAPID STREP A (OFFICE)      Component Value Range   Rapid Strep A Screen Negative  Negative   A:  Acute viral pharyngitis

## 2012-05-21 ENCOUNTER — Telehealth: Payer: Self-pay

## 2012-05-21 ENCOUNTER — Other Ambulatory Visit: Payer: Self-pay | Admitting: Family Medicine

## 2012-05-21 DIAGNOSIS — R059 Cough, unspecified: Secondary | ICD-10-CM

## 2012-05-21 DIAGNOSIS — R05 Cough: Secondary | ICD-10-CM

## 2012-05-21 MED ORDER — METHYLPREDNISOLONE 4 MG PO KIT: PACK | ORAL | Status: DC

## 2012-05-21 NOTE — Telephone Encounter (Signed)
PT SAID DR. TOLD HIM IF HE WASN'T ANY BETTER TO CALL HIM BACK.  HE SAYS HE IS WORSE AND WOULD LIKE TO HAVE A DIFFERENT SCRIPT CALLED IN.  PLEASE CALL [phone]

## 2012-05-21 NOTE — Telephone Encounter (Signed)
Called patient. He finished Zpack did not help, now has wheezing and cough please advise.

## 2012-06-02 ENCOUNTER — Other Ambulatory Visit: Payer: Self-pay | Admitting: Physician Assistant

## 2012-06-10 ENCOUNTER — Other Ambulatory Visit: Payer: Self-pay | Admitting: Physician Assistant

## 2012-06-11 ENCOUNTER — Telehealth: Payer: Self-pay

## 2012-06-11 MED ORDER — SIMVASTATIN 40 MG PO TABS: 40.0000 mg | ORAL_TABLET | Freq: Every day | ORAL | Status: DC

## 2012-06-11 NOTE — Telephone Encounter (Signed)
Pt requesting refill on simvastatin (ZOCOR) 40 MG tablet

## 2012-06-11 NOTE — Telephone Encounter (Signed)
Sent this in, for one month patient due for follow up.

## 2012-07-15 ENCOUNTER — Other Ambulatory Visit: Payer: Self-pay | Admitting: Emergency Medicine

## 2012-07-16 ENCOUNTER — Other Ambulatory Visit: Payer: Self-pay | Admitting: *Deleted

## 2012-07-16 MED ORDER — SIMVASTATIN 40 MG PO TABS: 40.0000 mg | ORAL_TABLET | Freq: Every day | ORAL | Status: DC

## 2012-07-17 ENCOUNTER — Other Ambulatory Visit: Payer: Self-pay | Admitting: Radiology

## 2012-08-08 ENCOUNTER — Telehealth: Payer: Self-pay

## 2012-08-08 NOTE — Telephone Encounter (Signed)
No answer

## 2012-08-08 NOTE — Telephone Encounter (Signed)
Called patient

## 2012-08-08 NOTE — Telephone Encounter (Signed)
[facility] requesting refill on Simvastatin 40 mg. Please advise

## 2012-08-08 NOTE — Telephone Encounter (Signed)
Patient needs an office visit.  

## 2012-08-13 ENCOUNTER — Other Ambulatory Visit: Payer: Self-pay | Admitting: Radiology

## 2012-08-13 MED ORDER — SIMVASTATIN 40 MG PO TABS: 40.0000 mg | ORAL_TABLET | Freq: Every day | ORAL | Status: DC

## 2012-08-13 NOTE — Telephone Encounter (Signed)
Patient advised of need for office visit for lab/s he will come in on Wed or Thursday.

## 2012-09-30 ENCOUNTER — Telehealth: Payer: Self-pay

## 2012-09-30 NOTE — Telephone Encounter (Signed)
Pt would like a refill on simvastatin, he has been out for about a week. He states that he has just been too busy to come in and would like to know if he could just get him enough to last him until he comes into the office. Best# [phone]

## 2012-10-01 MED ORDER — SIMVASTATIN 40 MG PO TABS: 40.0000 mg | ORAL_TABLET | Freq: Every day | ORAL | Status: DC

## 2012-10-01 NOTE — Telephone Encounter (Signed)
I have authorized 1 month of medication. He must come in for an office visit/labs before out

## 2012-10-01 NOTE — Telephone Encounter (Signed)
Please advise, can we renew? He is due for follow up?

## 2012-10-11 ENCOUNTER — Ambulatory Visit (INDEPENDENT_AMBULATORY_CARE_PROVIDER_SITE_OTHER): Payer: BC Managed Care – PPO | Admitting: Emergency Medicine

## 2012-10-11 VITALS — BP 125/85 | HR 80 | Temp 98.0°F | Resp 16 | Ht 68.5 in | Wt 188.2 lb

## 2012-10-11 DIAGNOSIS — R5381 Other malaise: Secondary | ICD-10-CM

## 2012-10-11 DIAGNOSIS — E785 Hyperlipidemia, unspecified: Secondary | ICD-10-CM

## 2012-10-11 DIAGNOSIS — IMO0001 Reserved for inherently not codable concepts without codable children: Secondary | ICD-10-CM

## 2012-10-11 DIAGNOSIS — I10 Essential (primary) hypertension: Secondary | ICD-10-CM

## 2012-10-11 DIAGNOSIS — R531 Weakness: Secondary | ICD-10-CM

## 2012-10-11 LAB — POCT CBC
Granulocyte percent: 64.1 %G (ref 37–80)
HCT, POC: 42 % — AB (ref 43.5–53.7)
Hemoglobin: 13.7 g/dL — AB (ref 14.1–18.1)
Lymph, poc: 1.1 (ref 0.6–3.4)
MCHC: 32.6 g/dL (ref 31.8–35.4)
MPV: 8.9 fL (ref 0–99.8)
POC Granulocyte: 2.6 (ref 2–6.9)
POC MID %: 7.8 %M (ref 0–12)

## 2012-10-11 LAB — POCT URINALYSIS DIPSTICK
Bilirubin, UA: NEGATIVE
Glucose, UA: NEGATIVE
Ketones, UA: NEGATIVE
Leukocytes, UA: NEGATIVE
Nitrite, UA: NEGATIVE

## 2012-10-11 LAB — COMPREHENSIVE METABOLIC PANEL
ALT: 30 U/L (ref 0–53)
AST: 31 U/L (ref 0–37)
Albumin: 4.8 g/dL (ref 3.5–5.2)
CO2: 27 mEq/L (ref 19–32)
Calcium: 9.9 mg/dL (ref 8.4–10.5)
Chloride: 106 mEq/L (ref 96–112)
Potassium: 4.7 mEq/L (ref 3.5–5.3)
Total Protein: 6.7 g/dL (ref 6.0–8.3)

## 2012-10-11 LAB — POCT UA - MICROSCOPIC ONLY
RBC, urine, microscopic: NEGATIVE
WBC, Ur, HPF, POC: NEGATIVE
Yeast, UA: NEGATIVE

## 2012-10-11 LAB — LIPID PANEL: Cholesterol: 177 mg/dL (ref 0–200)

## 2012-10-11 LAB — CK: Total CK: 142 U/L (ref 7–232)

## 2012-10-11 MED ORDER — DIAZEPAM 5 MG PO TABS: ORAL_TABLET | ORAL | Status: DC

## 2012-10-11 MED ORDER — LOSARTAN POTASSIUM-HCTZ 100-12.5 MG PO TABS: 1.0000 | ORAL_TABLET | Freq: Every day | ORAL | Status: DC

## 2012-10-11 MED ORDER — SIMVASTATIN 40 MG PO TABS: 40.0000 mg | ORAL_TABLET | Freq: Every day | ORAL | Status: DC

## 2012-10-11 NOTE — Patient Instructions (Addendum)
Rhabdomyolysis  Rhabdomyolysis is the breakdown of muscle fibers due to injury. The injury may come from physical damage to the muscle like an injury but other causes are:   High fever (hyperthermia).   Seizures (convulsions).   Low phosphate levels.   Diseases of metabolism.   Heatstroke.   Drug toxicity.   Over exertion.   Alcoholism.   Muscle is cut off from oxygen (anoxia).   The squeezing of nerves and blood vessels (compartment syndrome).  Some drugs which may cause the breakdown of muscle are:   Antibiotics.   Statins.   Alcohol.   Animal toxins.  Myoglobin is a substance which helps muscle use oxygen. When the muscle is damaged, the myoglobin is released into the bloodstream. It is filtered out of the bloodstream by the kidneys. Myoglobin may block up the kidneys. This may cause damage, such as kidney failure. It also breaks down into other damaging toxic parts, which also cause kidney failure.   SYMPTOMS    Dark, red, or tea colored urine.   Weakness of affected muscles.   Weight gain from water retention.   Joint aches and pains.   Irregular heart from high potassium in the blood.   Muscle tenderness or aching.   Generalized weakness.   Seizures.   Feeling tired (fatigue).  DIAGNOSIS   Your caregiver may find muscle tenderness on exam and suspect the problem. Urine tests and blood work can confirm the problem.  TREATMENT    Early and aggressive treatment with large amounts of fluids may help prevent kidney failure.   Water producing medicine (diuretic) may be used to help flush the kidneys.   High potassium and calcium problems (electrolyte) in your blood may need treatment.  HOME CARE INSTRUCTIONS   This problem is usually cared for in a hospital. If you are allowed to go home and require dialysis, make sure you keep all appointments for lab work and dialysis. Not doing so could result in death.  Document Released: 04/28/2004 Document Revised: 08/08/2011 Document Reviewed:  11/10/2008  Exit[employer] Patient Information 2013 [employer].

## 2012-10-11 NOTE — Progress Notes (Signed)
  Subjective:    Patient ID: Jeremiah [name], male    DOB: [birth year], 47 y.o.   MRN: [id number]  HPI patient states he has been doing well and then this weekend he decided to quit drinking. Tuesday he felt extremely weak fatigued and tired. At times he felt he may pass out. He also experienced significant aching and weakness in his arms and legs. He did not notice any hematuria. Of note the patient is on cholesterol medications blood pressure medications. He denies any chest pain with this and went to cardiac evaluation about 18 months ago.    Review of Systems     Objective:   Physical Exam Patient is alert and cooperative he is not in distress. His neck is supple. His chest is clear to auscultation and percussion. The abdomen is soft nontender extremities are without edema he has no muscle tenderness on exam today . EKG shows benign early      Assessment & Plan:  Would check an EKG lab work including CPK to be sure he is not getting significant rhabdomyolysis . I encouraged him to drink more fluids and cut back on his alcohol intake to

## 2012-10-12 ENCOUNTER — Encounter: Payer: Self-pay | Admitting: *Deleted

## 2012-10-17 ENCOUNTER — Telehealth: Payer: Self-pay

## 2012-10-17 NOTE — Telephone Encounter (Signed)
Labs normal, told him to expect letter that was mailed

## 2012-10-17 NOTE — Telephone Encounter (Signed)
PATIENT STATES HE WAS  IN THE OFFICE TO SEE DR. [name] LAST Thursday. HE HAS NOT HEARD ANYTHING BACK FROM [place] REGARDING HIS LAB RESULTS. BEST PHONE [phone] (CELL)   PHARMACY CHOICE IS [facility] ON [address]   MBC

## 2012-11-08 ENCOUNTER — Encounter: Payer: Self-pay | Admitting: Emergency Medicine

## 2012-12-05 ENCOUNTER — Other Ambulatory Visit: Payer: Self-pay | Admitting: Physician Assistant

## 2012-12-06 ENCOUNTER — Other Ambulatory Visit: Payer: Self-pay | Admitting: Radiology

## 2012-12-06 NOTE — Telephone Encounter (Signed)
Forward to Dr. [name] 

## 2012-12-06 NOTE — Telephone Encounter (Signed)
Called in diazepam, Dr Cleta Alberts printed but he is not here to sign

## 2012-12-30 ENCOUNTER — Other Ambulatory Visit: Payer: Self-pay | Admitting: Emergency Medicine

## 2013-01-23 ENCOUNTER — Other Ambulatory Visit: Payer: Self-pay | Admitting: Emergency Medicine

## 2013-01-29 ENCOUNTER — Ambulatory Visit: Payer: BC Managed Care – PPO | Admitting: Emergency Medicine

## 2013-04-02 ENCOUNTER — Other Ambulatory Visit: Payer: Self-pay | Admitting: Orthopedic Surgery

## 2013-04-02 DIAGNOSIS — M25511 Pain in right shoulder: Secondary | ICD-10-CM

## 2013-04-04 ENCOUNTER — Ambulatory Visit
Admission: RE | Admit: 2013-04-04 | Discharge: 2013-04-04 | Disposition: A | Discharge status: Home / Self Care | Payer: No Typology Code available for payment source | Source: Ambulatory Visit | Attending: Orthopedic Surgery | Admitting: Orthopedic Surgery

## 2013-04-04 ENCOUNTER — Other Ambulatory Visit: Payer: Self-pay | Admitting: Orthopedic Surgery

## 2013-04-04 DIAGNOSIS — Z139 Encounter for screening, unspecified: Secondary | ICD-10-CM

## 2013-04-07 ENCOUNTER — Ambulatory Visit
Admission: RE | Admit: 2013-04-07 | Discharge: 2013-04-07 | Disposition: A | Discharge status: Home / Self Care | Payer: No Typology Code available for payment source | Source: Ambulatory Visit | Attending: Orthopedic Surgery | Admitting: Orthopedic Surgery

## 2013-04-07 ENCOUNTER — Other Ambulatory Visit: Payer: BC Managed Care – PPO

## 2013-04-07 DIAGNOSIS — M25511 Pain in right shoulder: Secondary | ICD-10-CM

## 2013-05-21 ENCOUNTER — Other Ambulatory Visit: Payer: Self-pay | Admitting: Physician Assistant

## 2013-06-24 ENCOUNTER — Other Ambulatory Visit: Payer: Self-pay | Admitting: Physician Assistant

## 2013-07-02 ENCOUNTER — Encounter (HOSPITAL_BASED_OUTPATIENT_CLINIC_OR_DEPARTMENT_OTHER): Payer: Self-pay | Admitting: *Deleted

## 2013-07-02 ENCOUNTER — Other Ambulatory Visit: Payer: Self-pay | Admitting: Orthopedic Surgery

## 2013-07-02 NOTE — Progress Notes (Signed)
Pt heavy drinker-he is aware to slow down preop-was told her would stay overnight -to come in for bmet-had ekg 5/14-bring all meds and overnight bag

## 2013-07-03 ENCOUNTER — Encounter (HOSPITAL_BASED_OUTPATIENT_CLINIC_OR_DEPARTMENT_OTHER)
Admission: RE | Admit: 2013-07-03 | Discharge: 2013-07-03 | Disposition: A | Discharge status: Home / Self Care | Payer: 59 | Source: Ambulatory Visit | Attending: Orthopedic Surgery | Admitting: Orthopedic Surgery

## 2013-07-03 DIAGNOSIS — Z01812 Encounter for preprocedural laboratory examination: Secondary | ICD-10-CM | POA: Insufficient documentation

## 2013-07-03 LAB — BASIC METABOLIC PANEL
BUN: 15 mg/dL (ref 6–23)
CO2: 27 mEq/L (ref 19–32)
Calcium: 9.5 mg/dL (ref 8.4–10.5)
Chloride: 103 mEq/L (ref 96–112)
Creatinine, Ser: 0.88 mg/dL (ref 0.50–1.35)
GFR calc Af Amer: >90 mL/min (ref >90)
GFR calc non Af Amer: >90 mL/min (ref >90)
GLUCOSE: 131 mg/dL — AB (ref 70–99)
POTASSIUM: 4.3 meq/L (ref 3.7–5.3)
Sodium: 142 mEq/L (ref 137–147)

## 2013-07-04 ENCOUNTER — Ambulatory Visit (INDEPENDENT_AMBULATORY_CARE_PROVIDER_SITE_OTHER): Payer: 59 | Admitting: Emergency Medicine

## 2013-07-04 VITALS — BP 118/82 | HR 82 | Temp 98.1°F | Resp 18 | Ht 69.0 in | Wt 193.0 lb

## 2013-07-04 DIAGNOSIS — I10 Essential (primary) hypertension: Secondary | ICD-10-CM

## 2013-07-04 DIAGNOSIS — F439 Reaction to severe stress, unspecified: Secondary | ICD-10-CM

## 2013-07-04 DIAGNOSIS — E785 Hyperlipidemia, unspecified: Secondary | ICD-10-CM

## 2013-07-04 DIAGNOSIS — Z733 Stress, not elsewhere classified: Secondary | ICD-10-CM

## 2013-07-04 DIAGNOSIS — M25519 Pain in unspecified shoulder: Secondary | ICD-10-CM

## 2013-07-04 DIAGNOSIS — Z23 Encounter for immunization: Secondary | ICD-10-CM

## 2013-07-04 DIAGNOSIS — K219 Gastro-esophageal reflux disease without esophagitis: Secondary | ICD-10-CM

## 2013-07-04 MED ORDER — OMEPRAZOLE 40 MG PO CPDR: 40.0000 mg | DELAYED_RELEASE_CAPSULE | Freq: Every day | ORAL | Status: DC

## 2013-07-04 MED ORDER — LOSARTAN POTASSIUM-HCTZ 100-12.5 MG PO TABS: 1.0000 | ORAL_TABLET | Freq: Every day | ORAL | Status: DC

## 2013-07-04 MED ORDER — SIMVASTATIN 40 MG PO TABS: ORAL_TABLET | ORAL | Status: DC

## 2013-07-04 MED ORDER — DIAZEPAM 5 MG PO TABS: ORAL_TABLET | ORAL | Status: DC

## 2013-07-04 NOTE — Progress Notes (Signed)
Subjective:   This chart was scribed for Viviann SpareSteven A. Cleta Albertsaub, MD, by Yevette EdwardsAngela Bracken, ED Scribe.    Patient ID: Jeremiah [name], male    DOB: 06/01/1965, 48 y.o.   MRN: [id number]  HPI  Jeremiah [name] is a 48 y.o. male who [facility].   The pt is having shoulder surgery next week, on July [facility], 2013, [facility].   He also complains of acid reflux which increases at night. The pt has used Pepcid OTC with minimal relief. He denies eating immediately prior to sleep.   He did not receive an influenza vaccination this year.   Jeremiah [name] reports his mother was diagnosed with breast cancer which metastisized. He states that she has improved with treatment.  He reports his h/o chest pain was related to his stress level. 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   Past Medical History  Diagnosis Date  . Essential hypertension, benign   . Depression   . Reflux   . Hyperlipidemia   . Alcohol dependence   . Hyperglycemia   . HSV-2 (herpes simplex virus 2) infection   . Prostatitis   . Anxiety   . History of heavy alcohol consumption    Current Outpatient Prescriptions on File Prior to Visit  Medication Sig Dispense Refill  . diazepam (VALIUM) 5 MG tablet TAKE 1 TABLET BY MOUTH EVERY DAY ONLY ON AN AS NEEDED BASIS  30 tablet  0  . famotidine (PEPCID) 20 MG tablet Take 20 mg by mouth 2 (two) times daily.      Marland Kitchen. ibuprofen (ADVIL,MOTRIN) 200 MG tablet Take 200 mg by mouth every 6 (six) hours as needed.      Marland Kitchen. losartan-hydrochlorothiazide (HYZAAR) 100-12.5 MG per tablet Take 1 tablet by mouth daily.  30 tablet  11  . simvastatin (ZOCOR) 40 MG tablet TAKE 1 TABLET BY MOUTH EVERY NIGHT AT BEDTIME  30 tablet  0  . ipratropium (ATROVENT) 0.06 % nasal spray Place 2 sprays into the nose 4 (four) times daily.  15 mL  1  . [DISCONTINUED] hydrochlorothiazide (HYDRODIURIL) 25 MG tablet Take 25 mg by mouth  daily.       No current facility-administered medications on file prior to visit.     Review of Systems  Constitutional: Negative for fever, chills, fatigue and unexpected weight change.  Eyes: Negative for visual disturbance.  Respiratory: Negative for cough, chest tightness and shortness of breath.   Cardiovascular: Negative for chest pain, palpitations and leg swelling.  Gastrointestinal: Negative for abdominal pain and blood in stool.       Acid reflux  Neurological: Negative for dizziness, light-headedness and headaches.    Vitals: BP 118/82  Pulse 82  Temp(Src) 98.1 F (36.7 C) (Oral)  Resp 18  Ht 5\' 9"  (1.753 m)  Wt 193 lb (87.544 kg)  BMI 28.49 kg/m2  SpO2 99%     Objective:   Physical Exam   CONSTITUTIONAL: Well developed/well nourished HEAD: Normocephalic/atraumatic EYES: EOMI/PERRL ENMT: Mucous membranes moist NECK: supple no meningeal signs SPINE:entire spine nontender CV: S1/S2 noted, no murmurs/rubs/gallops noted LUNGS: Lungs are clear to auscultation bilaterally, no apparent distress ABDOMEN: soft, nontender, no rebound or guarding GU:no cva tenderness NEURO: Pt is awake/alert, moves all extremitiesx4 EXTREMITIES: pulses normal, full ROM SKIN: warm, color normal PSYCH: no abnormalities of mood noted      Assessment & Plan:  Patient looks good for  upcoming surgery. Meds were refilled. His EKG is good.

## 2013-07-08 NOTE — H&P (Signed)
Jeremiah [name] is an 48 y.o. male.   Chief Complaint: c/o chronic and progressive right shoulder pain HPI: .  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  He presents for evaluation of a six week history of pain in the right shoulder.  He sustained a torque injury to his shoulder while using a 50 pound hammer drill roughing in electrical conduit.  He caught some rebar and twisted his shoulder vigorously.  Since that occurred he has had night pain, weakness of abduction, flexion and scaption.  He tolerated his symptoms since mid August and now presents for upper extremity evaluation.   Past Medical History  Diagnosis Date  . Essential hypertension, benign   . Depression   . Reflux   . Hyperlipidemia   . Alcohol dependence   . Hyperglycemia   . HSV-2 (herpes simplex virus 2) infection   . Prostatitis   . Anxiety   . History of heavy alcohol consumption     Past Surgical History  Procedure Laterality Date  . Shoulder surgery  1983    left  . Cardiac catheterization  2006  . Orif finger fracture  2009    rt ring  . Cholecystectomy  1990    Family History  Problem Relation Age of Onset  . Cancer Mother     breast  . Hyperlipidemia Mother   . Hypertension Mother   . Hyperlipidemia Father   . Hypertension Father   . Heart disease Father   . Hyperlipidemia Sister   . Hypertension Sister   . Stroke Maternal Grandmother   . Heart attack Maternal Grandmother   . Heart attack Maternal Grandfather   . Hyperlipidemia Sister   . Hypertension Sister    Social History:  reports that he quit smoking about 21 years ago. His smoking use included Cigarettes. He smoked 0.00 packs per day. He does not have any smokeless tobacco history on file. He reports that he drinks alcohol. He reports that he does not use illicit drugs.  Allergies: No Known Allergies  No prescriptions prior to admission    No results found for this or any previous visit (from the past  48 hour(s)).  No results found.   Pertinent items are noted in HPI.  Height 5' 8.5" (1.74 m), weight 85.276 kg (188 lb).  General appearance: alert Head: Normocephalic, without obvious abnormality Neck: supple, symmetrical, trachea midline Resp: clear to auscultation bilaterally Cardio: regular rate and rhythm GI: normal findings: bowel sounds normal Extremities: Shoulder range of motion reveals combined elevation 170 symmetrical bilaterally, external rotation 90 degrees abduction 85 right and 85 left.  He internally rotates to T-8 on the right and T-6 on the left. He has painful push-off test on the right, negative on the left.  He has 5/5 strength in all planes of testing, but has a positive Speed's test on the right.    Plain films of the shoulder demonstrate normal bony anatomy.  He has a type II acromion.  He does not show signs of significant arthritis of the [facility] joint or glenohumeral joint.    [facility].  His images were reviewed by Dr. Maricela Curet'Alessio.  Dr. Maricela Curet'Alessio notes a type II SLAP tear, also notes tendinopathy of the subscap, supraspinatus, infraspinatus tendons without a retracted tear.  I carefully inspected the MRI films and feel that Jeremiah [name] probably has a grade II subscapularis tear.   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   [name],[name] 07/08/2013, 4:28 PM   H&P documentation: 07/09/2013  -History and Physical Reviewed  -Patient has been re-examined  -No change in the plan of care  Wyn Forster, MD

## 2013-07-09 ENCOUNTER — Ambulatory Visit (HOSPITAL_BASED_OUTPATIENT_CLINIC_OR_DEPARTMENT_OTHER)
Admission: RE | Admit: 2013-07-09 | Discharge: 2013-07-10 | Disposition: A | Discharge status: Home / Self Care | Payer: 59 | Source: Ambulatory Visit | Attending: Orthopedic Surgery | Admitting: Orthopedic Surgery

## 2013-07-09 ENCOUNTER — Encounter (HOSPITAL_BASED_OUTPATIENT_CLINIC_OR_DEPARTMENT_OTHER): Payer: Self-pay | Admitting: Orthopedic Surgery

## 2013-07-09 ENCOUNTER — Encounter (HOSPITAL_BASED_OUTPATIENT_CLINIC_OR_DEPARTMENT_OTHER): Payer: 59 | Admitting: Anesthesiology

## 2013-07-09 ENCOUNTER — Encounter (HOSPITAL_BASED_OUTPATIENT_CLINIC_OR_DEPARTMENT_OTHER)
Admission: RE | Disposition: A | Discharge status: Home / Self Care | Payer: Self-pay | Source: Ambulatory Visit | Attending: Orthopedic Surgery

## 2013-07-09 ENCOUNTER — Ambulatory Visit (HOSPITAL_BASED_OUTPATIENT_CLINIC_OR_DEPARTMENT_OTHER): Payer: 59 | Admitting: Anesthesiology

## 2013-07-09 DIAGNOSIS — I10 Essential (primary) hypertension: Secondary | ICD-10-CM | POA: Insufficient documentation

## 2013-07-09 DIAGNOSIS — E785 Hyperlipidemia, unspecified: Secondary | ICD-10-CM | POA: Insufficient documentation

## 2013-07-09 DIAGNOSIS — M25819 Other specified joint disorders, unspecified shoulder: Secondary | ICD-10-CM | POA: Insufficient documentation

## 2013-07-09 DIAGNOSIS — K219 Gastro-esophageal reflux disease without esophagitis: Secondary | ICD-10-CM | POA: Insufficient documentation

## 2013-07-09 DIAGNOSIS — F329 Major depressive disorder, single episode, unspecified: Secondary | ICD-10-CM | POA: Insufficient documentation

## 2013-07-09 DIAGNOSIS — M75101 Unspecified rotator cuff tear or rupture of right shoulder, not specified as traumatic: Secondary | ICD-10-CM | POA: Diagnosis present

## 2013-07-09 DIAGNOSIS — F411 Generalized anxiety disorder: Secondary | ICD-10-CM | POA: Insufficient documentation

## 2013-07-09 DIAGNOSIS — F3289 Other specified depressive episodes: Secondary | ICD-10-CM | POA: Insufficient documentation

## 2013-07-09 DIAGNOSIS — M7580 Other shoulder lesions, unspecified shoulder: Secondary | ICD-10-CM

## 2013-07-09 DIAGNOSIS — S46819A Strain of other muscles, fascia and tendons at shoulder and upper arm level, unspecified arm, initial encounter: Secondary | ICD-10-CM | POA: Insufficient documentation

## 2013-07-09 DIAGNOSIS — Z87891 Personal history of nicotine dependence: Secondary | ICD-10-CM | POA: Insufficient documentation

## 2013-07-09 DIAGNOSIS — X500XXA Overexertion from strenuous movement or load, initial encounter: Secondary | ICD-10-CM | POA: Insufficient documentation

## 2013-07-09 DIAGNOSIS — M19019 Primary osteoarthritis, unspecified shoulder: Secondary | ICD-10-CM | POA: Insufficient documentation

## 2013-07-09 DIAGNOSIS — F1020 Alcohol dependence, uncomplicated: Secondary | ICD-10-CM | POA: Insufficient documentation

## 2013-07-09 DIAGNOSIS — S43439A Superior glenoid labrum lesion of unspecified shoulder, initial encounter: Secondary | ICD-10-CM | POA: Insufficient documentation

## 2013-07-09 HISTORY — DX: Anxiety disorder, unspecified: F41.9

## 2013-07-09 HISTORY — PX: SHOULDER ARTHROSCOPY WITH SUBACROMIAL DECOMPRESSION, ROTATOR CUFF REPAIR AND BICEP TENDON REPAIR: SHX5687

## 2013-07-09 HISTORY — DX: Personal history of other specified conditions: Z87.898

## 2013-07-09 LAB — POCT HEMOGLOBIN-HEMACUE: Hemoglobin: 15.8 g/dL (ref 13.0–17.0)

## 2013-07-09 SURGERY — SHOULDER ARTHROSCOPY WITH SUBACROMIAL DECOMPRESSION, ROTATOR CUFF REPAIR AND BICEP TENDON REPAIR
Anesthesia: Regional | Site: Shoulder | Laterality: Right

## 2013-07-09 MED ORDER — IBUPROFEN 600 MG PO TABS
600.0000 mg | ORAL_TABLET | Freq: Four times a day (QID) | ORAL | Status: DC | PRN
  Administered 2013-07-09: 600 mg via ORAL
  Filled 2013-07-09: qty 1

## 2013-07-09 MED ORDER — DEXTROSE-NACL 5-0.45 % IV SOLN
INTRAVENOUS | Status: DC
  Administered 2013-07-09: 17:00:00 via INTRAVENOUS

## 2013-07-09 MED ORDER — ONDANSETRON HCL 4 MG/2ML IJ SOLN: 4.0000 mg | Freq: Four times a day (QID) | INTRAMUSCULAR | Status: DC | PRN

## 2013-07-09 MED ORDER — FENTANYL CITRATE 0.05 MG/ML IJ SOLN
50.0000 ug | INTRAMUSCULAR | Status: DC | PRN
  Administered 2013-07-09: 100 ug via INTRAVENOUS

## 2013-07-09 MED ORDER — MIDAZOLAM HCL 2 MG/2ML IJ SOLN
INTRAMUSCULAR | Status: AC
  Filled 2013-07-09: qty 2

## 2013-07-09 MED ORDER — METHOCARBAMOL 500 MG PO TABS: 500.0000 mg | ORAL_TABLET | Freq: Four times a day (QID) | ORAL | Status: DC | PRN

## 2013-07-09 MED ORDER — OXYCODONE-ACETAMINOPHEN 5-325 MG PO TABS
1.0000 | ORAL_TABLET | ORAL | Status: DC | PRN
  Administered 2013-07-09: 1 via ORAL
  Administered 2013-07-10: 2 via ORAL
  Filled 2013-07-09 (×3): qty 1

## 2013-07-09 MED ORDER — IPRATROPIUM BROMIDE 0.06 % NA SOLN: 2.0000 | Freq: Four times a day (QID) | NASAL | Status: DC

## 2013-07-09 MED ORDER — EPHEDRINE SULFATE 50 MG/ML IJ SOLN
INTRAMUSCULAR | Status: DC | PRN
  Administered 2013-07-09: 10 mg via INTRAVENOUS
  Administered 2013-07-09: 5 mg via INTRAVENOUS

## 2013-07-09 MED ORDER — METOCLOPRAMIDE HCL 5 MG PO TABS: 5.0000 mg | ORAL_TABLET | Freq: Three times a day (TID) | ORAL | Status: DC | PRN

## 2013-07-09 MED ORDER — FENTANYL CITRATE 0.05 MG/ML IJ SOLN
INTRAMUSCULAR | Status: AC
  Filled 2013-07-09: qty 2

## 2013-07-09 MED ORDER — DEXAMETHASONE SODIUM PHOSPHATE 4 MG/ML IJ SOLN
INTRAMUSCULAR | Status: DC | PRN
  Administered 2013-07-09: 10 mg via INTRAVENOUS

## 2013-07-09 MED ORDER — CEFAZOLIN SODIUM 1-5 GM-% IV SOLN
1.0000 g | Freq: Four times a day (QID) | INTRAVENOUS | Status: AC
  Administered 2013-07-09 – 2013-07-10 (×3): 1 g via INTRAVENOUS

## 2013-07-09 MED ORDER — PHENYLEPHRINE HCL 10 MG/ML IJ SOLN
INTRAMUSCULAR | Status: DC | PRN
  Administered 2013-07-09: 40 ug via INTRAVENOUS

## 2013-07-09 MED ORDER — ONDANSETRON HCL 4 MG PO TABS: 4.0000 mg | ORAL_TABLET | Freq: Four times a day (QID) | ORAL | Status: DC | PRN

## 2013-07-09 MED ORDER — FAMOTIDINE 20 MG PO TABS: 20.0000 mg | ORAL_TABLET | Freq: Two times a day (BID) | ORAL | Status: DC

## 2013-07-09 MED ORDER — CHLORHEXIDINE GLUCONATE 4 % EX LIQD: 60.0000 mL | Freq: Once | CUTANEOUS | Status: DC

## 2013-07-09 MED ORDER — CEPHALEXIN 500 MG PO CAPS: 500.0000 mg | ORAL_CAPSULE | Freq: Three times a day (TID) | ORAL | Status: DC

## 2013-07-09 MED ORDER — HYDROMORPHONE HCL PF 1 MG/ML IJ SOLN: 0.2500 mg | INTRAMUSCULAR | Status: DC | PRN

## 2013-07-09 MED ORDER — HYDROMORPHONE HCL PF 1 MG/ML IJ SOLN: 0.5000 mg | INTRAMUSCULAR | Status: DC | PRN

## 2013-07-09 MED ORDER — DOCUSATE SODIUM 100 MG PO CAPS: 100.0000 mg | ORAL_CAPSULE | Freq: Two times a day (BID) | ORAL | Status: DC

## 2013-07-09 MED ORDER — PROPOFOL 10 MG/ML IV BOLUS
INTRAVENOUS | Status: DC | PRN
  Administered 2013-07-09: 200 mg via INTRAVENOUS

## 2013-07-09 MED ORDER — HYDROMORPHONE HCL 2 MG PO TABS: ORAL_TABLET | ORAL | Status: DC

## 2013-07-09 MED ORDER — LACTATED RINGERS IV SOLN
INTRAVENOUS | Status: DC
  Administered 2013-07-09 (×2): via INTRAVENOUS

## 2013-07-09 MED ORDER — SODIUM CHLORIDE 0.9 % IR SOLN
Status: DC | PRN
  Administered 2013-07-09: 12000 mL

## 2013-07-09 MED ORDER — ONDANSETRON HCL 4 MG/2ML IJ SOLN
INTRAMUSCULAR | Status: DC | PRN
  Administered 2013-07-09: 4 mg via INTRAVENOUS

## 2013-07-09 MED ORDER — LOSARTAN POTASSIUM-HCTZ 100-12.5 MG PO TABS: 1.0000 | ORAL_TABLET | Freq: Every day | ORAL | Status: DC

## 2013-07-09 MED ORDER — SUCCINYLCHOLINE CHLORIDE 20 MG/ML IJ SOLN
INTRAMUSCULAR | Status: DC | PRN
  Administered 2013-07-09: 100 mg via INTRAVENOUS

## 2013-07-09 MED ORDER — ROPIVACAINE HCL 5 MG/ML IJ SOLN
INTRAMUSCULAR | Status: DC | PRN
  Administered 2013-07-09: 30 mL via PERINEURAL

## 2013-07-09 MED ORDER — METHOCARBAMOL 100 MG/ML IJ SOLN: 500.0000 mg | Freq: Four times a day (QID) | INTRAVENOUS | Status: DC | PRN

## 2013-07-09 MED ORDER — CEFAZOLIN SODIUM-DEXTROSE 2-3 GM-% IV SOLR
2.0000 g | Freq: Once | INTRAVENOUS | Status: AC
  Administered 2013-07-09: 2 g via INTRAVENOUS

## 2013-07-09 MED ORDER — METOCLOPRAMIDE HCL 5 MG/ML IJ SOLN: 5.0000 mg | Freq: Three times a day (TID) | INTRAMUSCULAR | Status: DC | PRN

## 2013-07-09 MED ORDER — MIDAZOLAM HCL 2 MG/2ML IJ SOLN
1.0000 mg | INTRAMUSCULAR | Status: DC | PRN
  Administered 2013-07-09: 2 mg via INTRAVENOUS

## 2013-07-09 MED ORDER — CEFAZOLIN SODIUM 1-5 GM-% IV SOLN
INTRAVENOUS | Status: AC
  Filled 2013-07-09: qty 50

## 2013-07-09 MED ORDER — OXYCODONE HCL 5 MG PO TABS: 5.0000 mg | ORAL_TABLET | Freq: Once | ORAL | Status: AC | PRN

## 2013-07-09 MED ORDER — LIDOCAINE HCL 4 % MT SOLN
OROMUCOSAL | Status: DC | PRN
  Administered 2013-07-09: 4 mL via TOPICAL

## 2013-07-09 MED ORDER — MIDAZOLAM HCL 5 MG/5ML IJ SOLN
INTRAMUSCULAR | Status: DC | PRN
  Administered 2013-07-09: 2 mg via INTRAVENOUS

## 2013-07-09 MED ORDER — OXYCODONE HCL 5 MG/5ML PO SOLN: 5.0000 mg | Freq: Once | ORAL | Status: AC | PRN

## 2013-07-09 MED ORDER — CEFAZOLIN SODIUM 1-5 GM-% IV SOLN
INTRAVENOUS | Status: AC
  Filled 2013-07-09: qty 100

## 2013-07-09 MED ORDER — HYDROCODONE-ACETAMINOPHEN 5-325 MG PO TABS: 1.0000 | ORAL_TABLET | ORAL | Status: DC | PRN

## 2013-07-09 MED ORDER — FENTANYL CITRATE 0.05 MG/ML IJ SOLN
INTRAMUSCULAR | Status: DC | PRN
  Administered 2013-07-09: 100 ug via INTRAVENOUS

## 2013-07-09 SURGICAL SUPPLY — 82 items
ANCH SUT SWLK 19.1X4.75 VT (Anchor) ×1 IMPLANT
ANCHOR PEEK 4.75X19.1 SWLK C (Anchor) ×2 IMPLANT
BANDAGE ADH SHEER 1  50/CT (GAUZE/BANDAGES/DRESSINGS) IMPLANT
BLADE AVERAGE 25MMX9MM (BLADE)
BLADE AVERAGE 25X9 (BLADE) IMPLANT
BLADE CUTTER MENIS 5.5 (BLADE) ×2 IMPLANT
BLADE SURG 15 STRL LF DISP TIS (BLADE) ×2 IMPLANT
BLADE SURG 15 STRL SS (BLADE) ×6
BUR EGG 3PK/BX (BURR) IMPLANT
BUR OVAL 6.0 (BURR) ×3 IMPLANT
CANISTER SUCT 3000ML (MISCELLANEOUS) IMPLANT
CANNULA SHOULDER 7CM (CANNULA) IMPLANT
CANNULA TWIST IN 8.25X7CM (CANNULA) ×4 IMPLANT
CLEANER CAUTERY TIP 5X5 PAD (MISCELLANEOUS) IMPLANT
CLOSURE WOUND 1/2 X4 (GAUZE/BANDAGES/DRESSINGS)
CUTTER MENISCUS  4.2MM (BLADE) ×2
CUTTER MENISCUS 4.2MM (BLADE) ×1 IMPLANT
DECANTER SPIKE VIAL GLASS SM (MISCELLANEOUS) IMPLANT
DRAPE INCISE IOBAN 66X45 STRL (DRAPES) ×3 IMPLANT
DRAPE STERI 35X30 U-POUCH (DRAPES) ×3 IMPLANT
DRAPE SURG 17X23 STRL (DRAPES) ×3 IMPLANT
DRAPE U-SHAPE 47X51 STRL (DRAPES) ×3 IMPLANT
DRAPE U-SHAPE 76X120 STRL (DRAPES) ×6 IMPLANT
DURAPREP 26ML APPLICATOR (WOUND CARE) ×3 IMPLANT
ELECT REM PT RETURN 9FT ADLT (ELECTROSURGICAL) ×3
ELECTRODE REM PT RTRN 9FT ADLT (ELECTROSURGICAL) IMPLANT
GLOVE BIOGEL M STRL SZ7.5 (GLOVE) ×3 IMPLANT
GLOVE BIOGEL PI IND STRL 7.0 (GLOVE) IMPLANT
GLOVE BIOGEL PI IND STRL 8 (GLOVE) ×2 IMPLANT
GLOVE BIOGEL PI INDICATOR 7.0 (GLOVE) ×4
GLOVE BIOGEL PI INDICATOR 8 (GLOVE) ×4
GLOVE ECLIPSE 6.5 STRL STRAW (GLOVE) ×2 IMPLANT
GLOVE ORTHO TXT STRL SZ7.5 (GLOVE) ×3 IMPLANT
GOWN STRL REUS W/ TWL LRG LVL3 (GOWN DISPOSABLE) ×1 IMPLANT
GOWN STRL REUS W/TWL LRG LVL3 (GOWN DISPOSABLE) ×3
GOWN STRL REUS W/TWL XL LVL4 (GOWN DISPOSABLE) ×6 IMPLANT
IV NS IRRIG 3000ML ARTHROMATIC (IV SOLUTION) ×8 IMPLANT
MANIFOLD NEPTUNE II (INSTRUMENTS) ×3 IMPLANT
NDL SCORPION (NEEDLE) ×1 IMPLANT
NDL SUT 6 .5 CRC .975X.05 MAYO (NEEDLE) IMPLANT
NEEDLE MAYO TAPER (NEEDLE)
NEEDLE MINI RC 24MM (NEEDLE) IMPLANT
NEEDLE SCORPION (NEEDLE) ×3 IMPLANT
PACK ARTHROSCOPY DSU (CUSTOM PROCEDURE TRAY) ×3 IMPLANT
PACK BASIN DAY SURGERY FS (CUSTOM PROCEDURE TRAY) ×3 IMPLANT
PAD ABD 8X10 STRL (GAUZE/BANDAGES/DRESSINGS) ×7 IMPLANT
PAD CLEANER CAUTERY TIP 5X5 (MISCELLANEOUS)
PASSER SUT SWANSON 36MM LOOP (INSTRUMENTS) IMPLANT
PENCIL BUTTON HOLSTER BLD 10FT (ELECTRODE) IMPLANT
SLEEVE SCD COMPRESS KNEE MED (MISCELLANEOUS) ×3 IMPLANT
SLING ARM LRG ADULT FOAM STRAP (SOFTGOODS) ×2 IMPLANT
SLING ARM MED ADULT FOAM STRAP (SOFTGOODS) IMPLANT
SPONGE GAUZE 4X4 12PLY (GAUZE/BANDAGES/DRESSINGS) ×5 IMPLANT
SPONGE LAP 4X18 X RAY DECT (DISPOSABLE) ×2 IMPLANT
STRIP CLOSURE SKIN 1/2X4 (GAUZE/BANDAGES/DRESSINGS) IMPLANT
SUCTION FRAZIER TIP 10 FR DISP (SUCTIONS) IMPLANT
SUT FIBERWIRE #2 38 T-5 BLUE (SUTURE)
SUT FIBERWIRE 3-0 18 TAPR NDL (SUTURE)
SUT PROLENE 1 CT (SUTURE) IMPLANT
SUT PROLENE 3 0 PS 2 (SUTURE) ×3 IMPLANT
SUT TIGER TAPE 7 IN WHITE (SUTURE) IMPLANT
SUT VIC AB 0 CT1 27 (SUTURE)
SUT VIC AB 0 CT1 27XBRD ANBCTR (SUTURE) IMPLANT
SUT VIC AB 0 SH 27 (SUTURE) IMPLANT
SUT VIC AB 2-0 SH 27 (SUTURE)
SUT VIC AB 2-0 SH 27XBRD (SUTURE) IMPLANT
SUT VIC AB 3-0 SH 27 (SUTURE)
SUT VIC AB 3-0 SH 27X BRD (SUTURE) IMPLANT
SUT VIC AB 3-0 X1 27 (SUTURE) IMPLANT
SUTURE FIBERWR #2 38 T-5 BLUE (SUTURE) IMPLANT
SUTURE FIBERWR 3-0 18 TAPR NDL (SUTURE) IMPLANT
SYR 3ML 23GX1 SAFETY (SYRINGE) IMPLANT
SYR BULB 3OZ (MISCELLANEOUS) IMPLANT
TAPE FIBER 2MM 7IN #2 BLUE (SUTURE) ×2 IMPLANT
TAPE PAPER 3X10 WHT MICROPORE (GAUZE/BANDAGES/DRESSINGS) ×3 IMPLANT
TOWEL OR 17X24 6PK STRL BLUE (TOWEL DISPOSABLE) ×3 IMPLANT
TUBE CONNECTING 20'X1/4 (TUBING) ×2
TUBE CONNECTING 20X1/4 (TUBING) ×3 IMPLANT
TUBING ARTHROSCOPY IRRIG 16FT (MISCELLANEOUS) IMPLANT
WAND STAR VAC 90 (SURGICAL WAND) ×3 IMPLANT
WATER STERILE IRR 1000ML POUR (IV SOLUTION) ×3 IMPLANT
YANKAUER SUCT BULB TIP NO VENT (SUCTIONS) IMPLANT

## 2013-07-09 NOTE — Anesthesia Preprocedure Evaluation (Signed)
Anesthesia Evaluation  Patient identified by MRN, date of birth, ID band Patient awake    Reviewed: Allergy & Precautions, H&P , NPO status , Patient's Chart, lab work & pertinent test results  Airway Mallampati: II TM Distance: >3 FB Neck ROM: Full    Dental no notable dental hx. (+) Teeth Intact and Dental Advisory Given   Pulmonary neg pulmonary ROS, former smoker,  breath sounds clear to auscultation  Pulmonary exam normal       Cardiovascular hypertension, On Medications Rhythm:Regular Rate:Normal     Neuro/Psych PSYCHIATRIC DISORDERS negative neurological ROS     GI/Hepatic Neg liver ROS, GERD-  Medicated and Controlled,  Endo/Other  negative endocrine ROS  Renal/GU negative Renal ROS  negative genitourinary   Musculoskeletal   Abdominal   Peds  Hematology negative hematology ROS (+)   Anesthesia Other Findings   Reproductive/Obstetrics negative OB ROS                           Anesthesia Physical Anesthesia Plan  ASA: II  Anesthesia Plan: General and Regional   Post-op Pain Management:    Induction: Intravenous  Airway Management Planned: Oral ETT  Additional Equipment:   Intra-op Plan:   Post-operative Plan: Extubation in OR  Informed Consent: I have reviewed the patients History and Physical, chart, labs and discussed the procedure including the risks, benefits and alternatives for the proposed anesthesia with the patient or authorized representative who has indicated his/her understanding and acceptance.   Dental advisory given  Plan Discussed with: CRNA  Anesthesia Plan Comments:         Anesthesia Quick Evaluation

## 2013-07-09 NOTE — Anesthesia Procedure Notes (Addendum)
Anesthesia Regional Block:  Interscalene brachial plexus block  Pre-Anesthetic Checklist: ,, timeout performed, Correct Patient, Correct Site, Correct Laterality, Correct Procedure, Correct Position, site marked, Risks and benefits discussed, pre-op evaluation,  At surgeon's request and post-op pain management  Laterality: Right  Prep: Maximum Sterile Barrier Precautions used and chloraprep       Needles:  Injection technique: Single-shot  Needle Type: Echogenic Stimulator Needle     Needle Length: 5cm 5 cm Needle Gauge: 22 and 22 G    Additional Needles:  Procedures: ultrasound guided (picture in chart) and nerve stimulator Interscalene brachial plexus block  Nerve Stimulator or Paresthesia:  Response: Biceps response,   Additional Responses:   Narrative:  Start time: 07/09/2013 12:45 PM End time: 07/09/2013 12:55 PM Injection made incrementally with aspirations every 5 mL. Anesthesiologist: Sampson GoonFitzgerald, MD  Additional Notes: 2% Lidocaine skin wheel.    Procedure Name: Intubation Date/Time: 07/09/2013 1:48 PM Performed by: Burna CashONRAD, Aubri Gathright C Pre-anesthesia Checklist: Patient identified, Emergency Drugs available, Suction available and Patient being monitored Patient Re-evaluated:Patient Re-evaluated prior to inductionOxygen Delivery Method: Circle System Utilized Preoxygenation: Pre-oxygenation with 100% oxygen Intubation Type: IV induction Ventilation: Mask ventilation without difficulty Laryngoscope Size: Mac and 3 Grade View: Grade I Tube type: Oral Tube size: 8.0 mm Number of attempts: 1 Airway Equipment and Method: stylet and oral airway Placement Confirmation: ETT inserted through vocal cords under direct vision,  positive ETCO2 and breath sounds checked- equal and bilateral Secured at: 23 cm Tube secured with: Tape Dental Injury: Teeth and Oropharynx as per pre-operative assessment

## 2013-07-09 NOTE — Discharge Instructions (Signed)

## 2013-07-09 NOTE — Progress Notes (Signed)
Assisted Dr. Fitzgerald with right, ultrasound guided, interscalene  block. Side rails up, monitors on throughout procedure. See vital signs in flow sheet. Tolerated Procedure well. 

## 2013-07-09 NOTE — Anesthesia Postprocedure Evaluation (Signed)
  Anesthesia Post-op Note  Patient: Jeremiah [name]  Procedure(s) Performed: Procedure(s): RIGHT SHOULDER ARTHROSCOPY WITH SUBACROMIAL DECOMPRESSION, DEBRIDEMENT,  SUBSCAPULARIS TENDON REPAIR (Right)  Patient Location: PACU  Anesthesia Type:General and Regional  Level of Consciousness: awake, alert  and oriented  Airway and Oxygen Therapy: Patient Spontanous Breathing  Post-op Pain: mild  Post-op Assessment: Post-op Vital signs reviewed, Patient's Cardiovascular Status Stable, Respiratory Function Stable, Patent Airway, No signs of Nausea or vomiting and Pain level controlled  Post-op Vital Signs: Reviewed and stable  Complications: No apparent anesthesia complications

## 2013-07-09 NOTE — Transfer of Care (Signed)
Immediate Anesthesia Transfer of Care Note  Patient: Jeremiah [name]  Procedure(s) Performed: Procedure(s): RIGHT SHOULDER ARTHROSCOPY WITH SUBACROMIAL DECOMPRESSION, DEBRIDEMENT,  SUBSCAPULARIS TENDON REPAIR (Right)  Patient Location: PACU  Anesthesia Type:GA combined with regional for post-op pain  Level of Consciousness: awake, alert  and oriented  Airway & Oxygen Therapy: Patient Spontanous Breathing and Patient connected to face mask oxygen  Post-op Assessment: Report given to PACU RN and Post -op Vital signs reviewed and stable  Post vital signs: Reviewed and stable  Complications: No apparent anesthesia complications

## 2013-07-09 NOTE — Op Note (Signed)
[id number] 

## 2013-07-09 NOTE — Brief Op Note (Signed)
07/09/2013  2:54 PM  PATIENT:  Jeremiah [name]  48 y.o. male  PRE-OPERATIVE DIAGNOSIS:  RIGHT SHOULDER TYPE 1-2 SLAP WITH ROTATOR CUFF TENDONOPATHY  POST-OPERATIVE DIAGNOSIS:  RIGHT SHOULDER TYPE 1-2 SLAP WITH ROTATOR CUFF TENDONOPATHY  PROCEDURE:  Procedure(s): RIGHT SHOULDER ARTHROSCOPY WITH SUBACROMIAL DECOMPRESSION, DEBRIDEMENT,  SUBSCAPULARIS TENDON REPAIR (Right)  SURGEON:  Surgeon(s) and Role:    * Wyn Forsterobert V Peyton Spengler Jr., MD - Primary  PHYSICIAN ASSISTANT:   ASSISTANTS: Mallory Shirkobert [name],P.A-C   ANESTHESIA:   general  EBL:  Total I/O In: 1700 [I.V.:1700] Out: -   BLOOD ADMINISTERED:none  DRAINS: none   LOCAL MEDICATIONS USED:  Ropivacaine plexus block  SPECIMEN:  No Specimen  DISPOSITION OF SPECIMEN:  N/A  COUNTS:  YES  TOURNIQUET:  * No tourniquets in log *  DICTATION: .Other Dictation: Dictation Number [id number]  PLAN OF CARE: Discharge to home after PACU  PATIENT DISPOSITION:  PACU - hemodynamically stable.   Delay start of Pharmacological VTE agent (>24hrs) due to surgical blood loss or risk of bleeding: not applicable

## 2013-07-10 NOTE — Op Note (Signed)
NAMMardelle [name]:  [place],                    ACCOUNT NO.:  [id number]  MEDICAL RECORD NO.:  [id number]  LOCATION:                                 FACILITY:  PHYSICIAN:  Katy Fitchobert V. Abdulaziz Toman, M.D.      DATE OF BIRTH:  DATE OF PROCEDURE:  07/09/2013 DATE OF DISCHARGE:                              OPERATIVE REPORT   PREOPERATIVE DIAGNOSES: 1. MRI documented rotator cuff tear of subscapularis with unfavorable     acromial anatomy. 2. Chronic subacromial and subcoracoid impingement. 3. Probable type 2 SLAP tear of biceps origin.  POSTOPERATIVE DIAGNOSES: 1. Type 1/type 2 SLAP tear with about 50% undermining of biceps origin     at superior labrum. 2. Grade 2 subscapularis tear with chronic subcoracoid impingement and     adhesions between retracted subscapularis coracoid. 3. Type 2 acromion with chronic anterior sub-coracoacromial ligament     impingement.  OPERATION: 1. Diagnostic arthroscopy, right glenohumeral joint. 2. Arthroscopic debridement of type 1/2 SLAP lesion, but decision not     to proceed with biceps tenodesis. 3. Debridement of bursal adhesions and acromioplasty for subacromial     decompression with coracoacromial ligament release. 4. Arthroscopic repair of subscapularis grade 2 tear.  OPERATING SURGEON:  Katy Fitchobert V. Oberia Beaudoin, M.D.  ASSISTANT:  Marveen Reeksobert J. [name], P.A.C.  ANESTHESIA:  General by endotracheal technique supplemented by ropivacaine plexus block preoperatively.  SUPERVISING ANESTHESIOLOGIST:  Zenon MayoW. Edmond Fitzgerald, M.D.  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  We followed him for 8 months for painful right shoulder symptoms.  He has had a history of multiple lifting injuries.  He reached the point where he could not use some of his power tools due to the amount of torque involved in controlling them.  He presented for evaluation of his shoulder and was noted to have signs of a subscapularis tear and probable biceps pathology.   Plain films were nondiagnostic except for a type 2 acromion.  An MRI of the shoulder was obtained in November of 2014 documenting subscapularis grade 2 tear.  Considerable bursitis and tendinopathy was noted adjacent to the coracoid with prominent coracoid tip suggesting chronic subcoracoid impingement and unfavorable anterolateral acromial morphology documented, but the [facility] was well preserved.  The rotator cuff had abnormal signal but did not reveal a retracted tear of the supraspinatus, infraspinatus, or teres minor.  Mr Claudette LawsStanisci has failed 8 months of nonoperative management.  We recommended he proceed at this time with diagnostic arthroscopy anticipating possible biceps tenodesis versus labral repair versus debridement.  He anticipated a repair of the subscapularis and prolonged lifting restrictions.  After detailed informed consent, he was brought to the OR at this time.  DESCRIPTION FOR PROCEDURE:  Preoperatively, he was interviewed by Dr. Sampson GoonFitzgerald of Anesthesia.  A ropivacaine plexus block was placed without complication leading to excellent anesthesia of the right upper extremity in the holding area.  His right arm was marked per protocol with the marking pen as the proper surgical site.  [facility] subsequently [facility] where under Dr. Jarrett AblesFitzgerald's direct supervision, general endotracheal anesthesia was induced.  He was carefully  positioned in the beach-chair position with a torso and head holder designed for shoulder arthroscopy.  The right upper extremity and forequarter were prepped with DuraPrep and he was draped with impervious arthroscopy drapes including waterproof stockinette.  Passive compression devices were applied to his calves.  A 2 g of Ancef administered as IV prophylactic antibiotic.  Following routine surgical time-out, procedure commenced with instrumentation with a switching stick anteriorly creating a  posterior viewing portal with blunt technique.  Diagnostic arthroscopy immediately confirmed the grade 2 subscapularis tear that was retracted and a significant comma sign was noted.  The coracoid was palpated and there was quite a bit of dense reactive bursa at the rotator interval.  An anterior superior lateral portal was created, and the bursa overlying the subscapularis and the coracoid was debrided.  I carefully inspected the coracoid and found that there was evidence of chronic impingement leading primary to bursitis and a tear of the subscapularis.  We elected to proceed with subscapularis repair at this point.  The long head of the biceps was intact to the rotator interval.  However, the superior labrum from approximately 1 o'clock anteriorly to 10 o'clock posteriorly was quite shaggy and hanging within the joint.  This was debrided to a smooth margin.  We then used a nerve hook to test the integrity of the biceps origin.  The peel back test revealed less than 50% footprint of the biceps origin of tearing.  This was debrided.  As the biceps origin was reasonably stable, we elected not to proceed with repair at age 46 and chose not to perform a biceps tenodesis.  The subscapularis was repaired with debridement of the lesser tuberosity with the suction shaver brought in through the anterior superior lateral portal.  A fiber tape was placed in the upper subscapularis with a Scorpion subsequently placed into a vented swivel lock, and an anatomic footprint repair of the upper subscapularis completed at the lesser tuberosity.  This created a satisfactory buttress to the biceps tendon and should prevent medial subluxation of the biceps tendon.  After completion of the cuff repair, it appeared that the supraspinatus, infraspinatus, and teres minor were satisfactory as was the remainder of the joint upon inspection.  The scope was removed from the glenohumeral joint and placed in  the subacromial space.  Florid bursitis was debrided followed by release of coracoacromial ligament.  The acromion was leveled to a type 1 morphology with a suction bur.  We then used the scope in the lateral portal to inspect the cuff.  The defect for portal placement at the anterior superior lateral position was noted.  We could see the subscap repair.  There was a curious amount of adhesion between the coracohumeral ligament, the coracoacromial ligament and the subscapularis.  We used a suction shaver to free the adhesions from the subscapularis and the rotator interval tissues from the coracoid and carefully inspected the coracoid.  In my judgment, coracoplasty was not necessary following restoration of tension in the subscapularis with repair.  We subsequently obtained hemostasis and carefully inspected the cuff. The posterior bursectomy was accomplished.  We elected not to proceed with any further attention to the rotator cuff.  The [facility] joint was inspected and found to be not prominent nor unstable. After hemostasis was achieved, the subacromial space was irrigated and the arthroscopic equipment was removed.  The portals were repaired with intradermal 3-0 Prolene.  Compressive dressing applied with paper tape.  Mr Lofgren will be admitted to the  recovery care center for observation of his vital signs, for 3 doses of Ancef 2 g IV q.8 hours, and appropriate analgesics.     Katy Fitch Tayari Yankee, M.D.     RVS/MEDQ  D:  07/09/2013  T:  07/10/2013  Job:  [id number]

## 2013-07-11 ENCOUNTER — Encounter (HOSPITAL_BASED_OUTPATIENT_CLINIC_OR_DEPARTMENT_OTHER): Payer: Self-pay | Admitting: Orthopedic Surgery

## 2013-07-12 ENCOUNTER — Other Ambulatory Visit: Payer: Self-pay | Admitting: Emergency Medicine

## 2013-07-12 DIAGNOSIS — M25519 Pain in unspecified shoulder: Secondary | ICD-10-CM

## 2013-07-12 NOTE — Addendum Note (Signed)
Addended by: Lesle ChrisAUB, Lotoya Casella A on: 07/12/2013 11:49 AM   Modules accepted: Orders

## 2013-10-12 ENCOUNTER — Other Ambulatory Visit: Payer: Self-pay | Admitting: Emergency Medicine

## 2013-10-29 ENCOUNTER — Ambulatory Visit (INDEPENDENT_AMBULATORY_CARE_PROVIDER_SITE_OTHER): Payer: 59 | Admitting: Emergency Medicine

## 2013-10-29 ENCOUNTER — Encounter: Payer: Self-pay | Admitting: Emergency Medicine

## 2013-10-29 VITALS — BP 138/88 | HR 77 | Temp 98.0°F | Resp 16 | Ht 68.0 in | Wt 191.0 lb

## 2013-10-29 DIAGNOSIS — Z1211 Encounter for screening for malignant neoplasm of colon: Secondary | ICD-10-CM

## 2013-10-29 DIAGNOSIS — I10 Essential (primary) hypertension: Secondary | ICD-10-CM

## 2013-10-29 DIAGNOSIS — Z23 Encounter for immunization: Secondary | ICD-10-CM

## 2013-10-29 DIAGNOSIS — E785 Hyperlipidemia, unspecified: Secondary | ICD-10-CM

## 2013-10-29 DIAGNOSIS — Z0000 Encounter for general adult medical examination without abnormal findings: Secondary | ICD-10-CM

## 2013-10-29 DIAGNOSIS — R079 Chest pain, unspecified: Secondary | ICD-10-CM

## 2013-10-29 LAB — POCT URINALYSIS DIPSTICK
Bilirubin, UA: NEGATIVE
Glucose, UA: NEGATIVE
KETONES UA: NEGATIVE
Leukocytes, UA: NEGATIVE
Nitrite, UA: NEGATIVE
PH UA: 5.5
PROTEIN UA: NEGATIVE
RBC UA: NEGATIVE
SPEC GRAV UA: 1.015
Urobilinogen, UA: 0.2

## 2013-10-29 LAB — CBC WITH DIFFERENTIAL/PLATELET
BASOS PCT: 1 % (ref 0–1)
Basophils Absolute: 0.1 10*3/uL (ref 0.0–0.1)
EOS PCT: 1 % (ref 0–5)
Eosinophils Absolute: 0.1 10*3/uL (ref 0.0–0.7)
HEMATOCRIT: 40.3 % (ref 39.0–52.0)
HEMOGLOBIN: 14.6 g/dL (ref 13.0–17.0)
Lymphocytes Relative: 27 % (ref 12–46)
Lymphs Abs: 1.8 10*3/uL (ref 0.7–4.0)
MCH: 31.1 pg (ref 26.0–34.0)
MCHC: 36.2 g/dL — AB (ref 30.0–36.0)
MCV: 85.9 fL (ref 78.0–100.0)
MONO ABS: 0.5 10*3/uL (ref 0.1–1.0)
Monocytes Relative: 8 % (ref 3–12)
Neutro Abs: 4.1 10*3/uL (ref 1.7–7.7)
Neutrophils Relative %: 63 % (ref 43–77)
Platelets: 199 10*3/uL (ref 150–400)
RBC: 4.69 MIL/uL (ref 4.22–5.81)
RDW: 13.8 % (ref 11.5–15.5)
WBC: 6.5 10*3/uL (ref 4.0–10.5)

## 2013-10-29 LAB — LIPID PANEL
Cholesterol: 209 mg/dL — ABNORMAL HIGH (ref 0–200)
HDL: 56 mg/dL (ref >39)
LDL CALC: 132 mg/dL — AB (ref 0–99)
Total CHOL/HDL Ratio: 3.7 Ratio
Triglycerides: 106 mg/dL (ref <150)
VLDL: 21 mg/dL (ref 0–40)

## 2013-10-29 LAB — COMPLETE METABOLIC PANEL WITH GFR
ALK PHOS: 58 U/L (ref 39–117)
ALT: 33 U/L (ref 0–53)
AST: 30 U/L (ref 0–37)
Albumin: 4.9 g/dL (ref 3.5–5.2)
BUN: 16 mg/dL (ref 6–23)
CALCIUM: 10 mg/dL (ref 8.4–10.5)
CO2: 26 mEq/L (ref 19–32)
CREATININE: 0.89 mg/dL (ref 0.50–1.35)
Chloride: 102 mEq/L (ref 96–112)
GFR, Est African American: >89 mL/min
Glucose, Bld: 88 mg/dL (ref 70–99)
Potassium: 3.6 mEq/L (ref 3.5–5.3)
Sodium: 138 mEq/L (ref 135–145)
Total Bilirubin: 1.1 mg/dL (ref 0.2–1.2)
Total Protein: 7.3 g/dL (ref 6.0–8.3)

## 2013-10-29 LAB — IFOBT (OCCULT BLOOD): IFOBT: NEGATIVE

## 2013-10-29 MED ORDER — ZOSTER VACCINE LIVE 19400 UNT/0.65ML ~~LOC~~ SOLR: 0.6500 mL | Freq: Once | SUBCUTANEOUS | Status: DC

## 2013-10-29 MED ORDER — SIMVASTATIN 40 MG PO TABS: ORAL_TABLET | ORAL | Status: DC

## 2013-10-29 MED ORDER — LOSARTAN POTASSIUM-HCTZ 100-12.5 MG PO TABS: 1.0000 | ORAL_TABLET | Freq: Every day | ORAL | Status: DC

## 2013-10-29 NOTE — Progress Notes (Deleted)
   Subjective:    Patient ID: Jeremiah [name], male    DOB: 1965/08/28, 48 y.o.   MRN: [id number]  HPI    Review of Systems     Objective:   Physical Exam        Assessment & Plan:

## 2013-10-29 NOTE — Progress Notes (Signed)
@UMFCLOGO @  Patient ID: Jeremiah [name] MRN: [id number], DOB: Oct 23, 1965 48 y.o. Date of Encounter: 10/29/2013, 2:32 PM  Primary Physician: Lucilla Edin, MD  Chief Complaint: Physical (CPE)  HPI: 48 y.o. y/o male with history noted below here for CPE.  Doing well. No issues/complaints.  Review of Systems Consitutional: No fever, chills, fatigue, night sweats, lymphadenopathy, or weight changes. Eyes: No visual changes, eye redness, or discharge. ENT/Mouth: Ears: No otalgia, tinnitus, hearing loss, discharge. Nose: No congestion, rhinorrhea, sinus pain, or epistaxis. Throat: No sore throat, post nasal drip, or teeth pain. Cardiovascular: No CP, palpitations, diaphoresis, DOE, edema, orthopnea, PND. Respiratory: No cough, hemoptysis, SOB, or wheezing. Gastrointestinal: No anorexia, dysphagia, reflux, pain, nausea, vomiting, hematemesis, diarrhea, constipation, BRBPR, or melena. Genitourinary: No dysuria, frequency, urgency, hematuria, incontinence, nocturia, decreased urinary stream, discharge, impotence, or testicular pain/masses. Musculoskeletal: No decreased ROM, myalgias, stiffness, joint swelling, or weakness. Skin: No rash, erythema, lesion changes, pain, warmth, jaundice, or pruritis. Neurological: No headache, dizziness, syncope, seizures, tremors, memory loss, coordination problems, or paresthesias. Psychological: No anxiety, depression, hallucinations, SI/HI. Endocrine: No fatigue, polydipsia, polyphagia, polyuria, or known diabetes. All other systems were reviewed and are otherwise negative.  Past Medical History  Diagnosis Date  . Essential hypertension, benign   . Depression   . Reflux   . Hyperlipidemia   . Alcohol dependence   . Hyperglycemia   . HSV-2 (herpes simplex virus 2) infection   . Prostatitis   . Anxiety   . History of heavy alcohol consumption      Past Surgical History  Procedure Laterality Date  . Shoulder surgery  1983    left  . Cardiac  catheterization  2006  . Orif finger fracture  2009    rt ring  . Cholecystectomy  1990  . Shoulder arthroscopy with subacromial decompression, rotator cuff repair and bicep tendon repair Right 07/09/2013    Procedure: RIGHT SHOULDER ARTHROSCOPY WITH SUBACROMIAL DECOMPRESSION, DEBRIDEMENT,  SUBSCAPULARIS TENDON REPAIR;  Surgeon: Wyn Forster., MD;  Location: [facility];  Service: Orthopedics;  Laterality: Right;    Home Meds:  Prior to Admission medications   Medication Sig Start Date End Date Taking? Authorizing Provider  diazepam (VALIUM) 5 MG tablet TAKE 1 TABLET BY MOUTH EVERY DAY ONLY ON AN AS NEEDED BASIS 07/04/13  Yes Collene Gobble, MD  famotidine (PEPCID) 20 MG tablet Take 20 mg by mouth 2 (two) times daily.   Yes Historical Provider, MD  ibuprofen (ADVIL,MOTRIN) 200 MG tablet Take 200 mg by mouth every 6 (six) hours as needed.   Yes Historical Provider, MD  losartan-hydrochlorothiazide (HYZAAR) 100-12.5 MG per tablet Take 1 tablet by mouth daily. 10/29/13  Yes Collene Gobble, MD  omeprazole (PRILOSEC) 40 MG capsule Take 1 capsule (40 mg total) by mouth daily. 07/04/13  Yes Collene Gobble, MD  simvastatin (ZOCOR) 40 MG tablet TAKE 1 TABLET BY MOUTH EVERY NIGHT AT BEDTIME 10/29/13  Yes Collene Gobble, MD  cephALEXin (KEFLEX) 500 MG capsule Take 1 capsule (500 mg total) by mouth 3 (three) times daily. 07/09/13   Marveen Reeks [name], PA-C  HYDROmorphone (DILAUDID) 2 MG tablet 1 or 2 tabs every 4 hours as needed for pain 07/09/13   Marveen Reeks [name], PA-C  ipratropium (ATROVENT) 0.06 % nasal spray Place 2 sprays into the nose 4 (four) times daily. 01/23/12 01/22/13  Shade Flood, MD  losartan-hydrochlorothiazide (HYZAAR) 100-12.5 MG per tablet TAKE 1 TABLET BY MOUTH EVERY DAY 10/12/13  Collene Gobble, MD  zoster vaccine live, PF, (ZOSTAVAX) 16109 UNT/0.65ML injection Inject 19,400 Units into the skin once. 10/29/13   Collene Gobble, MD    Allergies: No Known Allergies  History    Social History  . Marital Status: Single    Spouse Name: N/A    Number of Children: N/A  . Years of Education: N/A   Occupational History  . Not on file.   Social History Main Topics  . Smoking status: Former Smoker    Types: Cigarettes    Quit date: 07/02/1992  . Smokeless tobacco: Not on file     Comment: quit 10-15 yrs ago  . Alcohol Use: Yes     Comment: 6-12 pack nightly-6-7 beers,occ vodka,drinks every night  . Drug Use: No  . Sexual Activity: Yes   Other Topics Concern  . Not on file   Social History Narrative  . No narrative on file    Family History  Problem Relation Age of Onset  . Cancer Mother     breast  . Hyperlipidemia Mother   . Hypertension Mother   . Hyperlipidemia Father   . Hypertension Father   . Heart disease Father   . Hyperlipidemia Sister   . Hypertension Sister   . Stroke Maternal Grandmother   . Heart attack Maternal Grandmother   . Heart attack Maternal Grandfather   . Hyperlipidemia Sister   . Hypertension Sister     Physical Exam: Blood pressure 138/88, pulse 77, temperature 98 F (36.7 C), temperature source Oral, resp. rate 16, height 5\' 8"  (1.727 m), weight 191 lb (86.637 kg), SpO2 99.00%.  General: Well developed, well nourished, in no acute distress. HEENT: Normocephalic, atraumatic. Conjunctiva pink, sclera non-icteric. Pupils 2 mm constricting to 1 mm, round, regular, and equally reactive to light and accomodation. EOMI. Internal auditory canal clear. TMs with good cone of light and without pathology. Nasal mucosa pink. Nares are without discharge. No sinus tenderness. Oral mucosa pink. Dentition. Pharynx without exudate.   Neck: Supple. Trachea midline. No thyromegaly. Full ROM. No lymphadenopathy. Lungs: Clear to auscultation bilaterally without wheezes, rales, or rhonchi. Breathing is of normal effort and unlabored. Cardiovascular: RRR with S1 S2. No murmurs, rubs, or gallops appreciated. Distal pulses 2+ symmetrically.  No carotid or abdominal bruits. Abdomen: Soft, non-tender, non-distended with normoactive bowel sounds. No hepatosplenomegaly or masses. No rebound/guarding. No CVA tenderness. Without hernias.  Rectal: No external hemorrhoids or fissures. Rectal vault without masses.  Genitourinary:  circumcised male. No penile lesions. Testes descended bilaterally, and smooth without tenderness or masses.  Musculoskeletal: Full range of motion and 5/5 strength throughout. Without swelling, atrophy, tenderness, crepitus, or warmth. Extremities without clubbing, cyanosis, or edema. Calves supple. Skin: Warm and moist without erythema, ecchymosis, wounds, or rash. Neuro: A+Ox3. CN II-XII grossly intact. Moves all extremities spontaneously. Full sensation throughout. Normal gait. DTR 2+ throughout upper and lower extremities. Finger to nose intact. Psych:  Responds to questions appropriately with a normal affect.   Results for orders placed in visit on 10/29/13  IFOBT (OCCULT BLOOD)      Result Value Ref Range   IFOBT Negative    POCT URINALYSIS DIPSTICK      Result Value Ref Range   Color, UA yellow     Clarity, UA clear     Glucose, UA neg     Bilirubin, UA neg     Ketones, UA neg     Spec Grav, UA 1.015     Blood,  UA neg     pH, UA 5.5     Protein, UA neg     Urobilinogen, UA 0.2     Nitrite, UA neg     Leukocytes, UA Negative     Assessment/Plan:  48 y.o. y/o  male here for CPE. History of hypertension and high cholesterol. He does drink at night. We have discussed this in the past and he is encouraged to decrease the amount. He is not a smoker. He was catheterized in 2007 but his had not had a catheterization since that time it certainly would be appropriate to have repeat cardiovascular evaluation. I've also scheduled him to have his colonoscopy. He was given a prescription for shingles vaccine. No change in medications at the present time. -  Signed, Earl LitesSteve Muhammadali Ries, MD 10/29/2013 2:32 PM

## 2013-10-30 ENCOUNTER — Other Ambulatory Visit: Payer: Self-pay | Admitting: *Deleted

## 2013-10-30 DIAGNOSIS — E782 Mixed hyperlipidemia: Secondary | ICD-10-CM

## 2013-10-30 LAB — TSH: TSH: 1.407 u[IU]/mL (ref 0.350–4.500)

## 2013-10-30 LAB — PSA: PSA: 1.19 ng/mL (ref <=4.00)

## 2013-10-30 MED ORDER — ATORVASTATIN CALCIUM 20 MG PO TABS
20.0000 mg | ORAL_TABLET | Freq: Every day | ORAL | Status: DC
Start: 2013-10-30 — End: 2014-09-29

## 2013-12-07 ENCOUNTER — Telehealth: Payer: Self-pay | Admitting: Cardiovascular Disease

## 2013-12-07 NOTE — Telephone Encounter (Signed)
Closed encounter °

## 2014-01-02 ENCOUNTER — Ambulatory Visit: Payer: No Typology Code available for payment source | Admitting: Cardiology

## 2014-01-16 ENCOUNTER — Encounter: Payer: Self-pay | Admitting: *Deleted

## 2014-01-17 ENCOUNTER — Encounter: Payer: Self-pay | Admitting: *Deleted

## 2014-01-20 ENCOUNTER — Encounter: Payer: Self-pay | Admitting: Cardiology

## 2014-01-20 ENCOUNTER — Encounter: Payer: Self-pay | Admitting: Cardiovascular Disease

## 2014-01-20 ENCOUNTER — Ambulatory Visit (INDEPENDENT_AMBULATORY_CARE_PROVIDER_SITE_OTHER): Payer: 59 | Admitting: Cardiology

## 2014-01-20 VITALS — BP 142/94 | HR 67 | Ht 69.0 in | Wt 193.0 lb

## 2014-01-20 DIAGNOSIS — D689 Coagulation defect, unspecified: Secondary | ICD-10-CM

## 2014-01-20 DIAGNOSIS — R079 Chest pain, unspecified: Secondary | ICD-10-CM

## 2014-01-20 DIAGNOSIS — Z01818 Encounter for other preprocedural examination: Secondary | ICD-10-CM

## 2014-01-20 MED ORDER — NITROGLYCERIN 0.4 MG SL SUBL: 0.4000 mg | SUBLINGUAL_TABLET | SUBLINGUAL | Status: DC | PRN

## 2014-01-20 NOTE — Progress Notes (Signed)
01/20/2014 Jeremiah Jeremiah [name]   01/02/1966  [id number]  Primary Physicia Jeremiah Jeremiah [name], Jeremiah Head, MD Primary Cardiologist: Dr. Tresa Jeremiah [name]   HPI: The patient is a 48 y/o male, previously followed by Dr. Elsie Jeremiah [name] and now followed by Dr. Tresa Jeremiah [name]. He has not been seen since 2011. He was recently evaluated by his PCP, Dr. Cleta Jeremiah [name], who recommended that he f/u in our office for routine evaluation and consideration for a repeat LHC.   His history is significant for a strong family history of CAD. His father had his first MI at age 48, CABG at age 34 and redo CABG at age 59. The patient himself has a personal history of HTN and HLD. His simvastatin was recently discontinued and he was placed on Lipitor by his PCP due to an elevated LDL of 132.He also has a h/o prior tobacco use, but quit at age 37. He underwent a LHC by Dr. Elsie Jeremiah [name] in 2006 that demonstrated normal coronaries. He also had a 5 year f/u NST that was negative for ischemia and demonstrated normal LV function.   He is here today because he recently turned 48 and has concerns regarding his overall cardiovascular health given his family history, other cardiac risk factors and frequent symptoms of chest discomfort. He notes frequent left sided chest tightness radiating to his neck with bilateral upper extremity numbness and dyspnea. Symptoms offen occur at rest and at night wakening him from his sleep. Symptoms are also brought on my emotional stress. 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 He is unsure if his symptoms are cardiac related or due to stress/ anxiety attacks. His symptoms usually resolve spontaneously or after taking Valium. He is skeptical regarding undergoing further evaluation with a NST, because he reports having a false positive test in the past that was done in [place].     Current Outpatient Prescriptions  Medication Sig Dispense Refill  . atorvastatin (LIPITOR) 20 MG tablet Take 1 tablet (20 mg total) by  mouth daily.  30 tablet  11  . diazepam (VALIUM) 5 MG tablet TAKE 1 TABLET BY MOUTH EVERY DAY ONLY ON AN AS NEEDED BASIS  30 tablet  5  . famotidine (PEPCID) 20 MG tablet Take 20 mg by mouth 2 (two) times daily.      Marland Kitchen ibuprofen (ADVIL,MOTRIN) 200 MG tablet Take 200 mg by mouth every 6 (six) hours as needed.      Marland Kitchen losartan-hydrochlorothiazide (HYZAAR) 100-12.5 MG per tablet Take 1 tablet by mouth daily.  30 tablet  11  . zoster vaccine live, PF, (ZOSTAVAX) 78295 UNT/0.65ML injection Inject 19,400 Units into the skin once.  1 each  0  . ipratropium (ATROVENT) 0.06 % nasal spray Place 2 sprays into the nose 4 (four) times daily.  15 mL  1  . [DISCONTINUED] hydrochlorothiazide (HYDRODIURIL) 25 MG tablet Take 25 mg by mouth daily.       No current facility-administered medications for this visit.    No Known Allergies  History   Social History  . Marital Status: Single    Spouse Name: N/A    Number of Children: N/A  . Years of Education: N/A   Occupational History  . Not on file.   Social History Main Topics  . Smoking status: Former Smoker    Types: Cigarettes    Quit date: 07/02/1992  . Smokeless tobacco: Not on file     Comment: quit 10-15 yrs ago  .  Alcohol Use: Yes     Comment: 6-12 pack nightly-6-7 beers,occ vodka,drinks every night  . Drug Use: No  . Sexual Activity: Yes   Other Topics Concern  . Not on file   Social History Narrative  . No narrative on file     Review of Systems: General: negative for chills, fever, night sweats or weight changes.  Cardiovascular: negative for chest pain, dyspnea on exertion, edema, orthopnea, palpitations, paroxysmal nocturnal dyspnea or shortness of breath Dermatological: negative for rash Respiratory: negative for cough or wheezing Urologic: negative for hematuria Abdominal: negative for nausea, vomiting, diarrhea, bright red blood per rectum, melena, or hematemesis Neurologic: negative for visual changes, syncope, or  dizziness All other systems reviewed and are otherwise negative except as noted above.    Blood pressure 142/94, pulse 67, Jeremiah [name]  (1.753 m), weight 193 lb (87.544 kg).  General appearance: alert, cooperative and no distress Neck: no carotid bruit and no JVD Lungs: clear to auscultation bilaterally Heart: regular rate and rhythm, S1, S2 normal, no murmur, click, rub or gallop Extremities: no LEE Pulses: 2+ and symmetric Skin: warm and dry Neurologic: Grossly normal  EKG NSR 67 bpm  ASSESSMENT AND PLAN:   1. Chest Pain:  The patient is with multiple cardiac risk factors including a strong family h/o of CAD. He also has had frequent symptoms of what may be unstable angina, however symptoms also may be stress related/ due to anxiety. However, he wishes to forego risk stratification with a NST, because he had false results in the past. He is very concerned over his overall cardiovascular health, given his father's history, and he wishes to undergo definitive testing for reassurance. Given his family history, multiple risk factors including HTN, HLD and prior tobacco use, as well as symptoms that occur at rest, I feel that undergoing a repeat diagnostic LHC is appropriate at this time. Continue medical therapy for now, including daily ASA and statin. PRN SL NTG was prescribed.   2. Hypertension: Mildly elevated at 142/94. Followed by his PCP. Continue losartan-hydrochlorothiazide.  3. Hyperlipidemia: Last lipid panel in June 2015 revealed elevated LDL at 132 mg/dL. His simvastatin was recently discontinued and he is now on 20 mg of Lipitor daily. This is also followed by his PCP.    PLAN  Plan for diagnostic left heart catheterization with Dr. Tresa Jeremiah [name] at [facility]. Continue medical therapy as outlined above.  Brityn Mastrogiovanni, BRITTAINYPA-C 01/20/2014 10:34 AM

## 2014-01-20 NOTE — Patient Instructions (Addendum)
Your physician has requested that you have a cardiac catheterization. Cardiac catheterization is used to diagnose and/or treat various heart conditions. Doctors may recommend this procedure for a number of different reasons. The most common reason is to evaluate chest pain. Chest pain can be a symptom of coronary artery disease (CAD), and cardiac catheterization can show whether plaque is narrowing or blocking your heart's arteries. This procedure is also used to evaluate the valves, as well as measure the blood flow and oxygen levels in different parts of your heart. For further information please visit [email or web address] Please follow instruction sheet, as given. This will be scheduled with Dr. Tresa Endo  Your physician recommends that you return for lab work and chest xray within 7 days of the procedure @ [address].  Your physician recommends that you schedule a follow-up appointment in: 2 week following procedure. This will be scheduled at the time of your discharge.

## 2014-01-21 ENCOUNTER — Other Ambulatory Visit: Payer: Self-pay | Admitting: Emergency Medicine

## 2014-01-21 ENCOUNTER — Encounter: Payer: Self-pay | Admitting: Cardiovascular Disease

## 2014-01-22 NOTE — Telephone Encounter (Signed)
Faxed

## 2014-01-28 ENCOUNTER — Other Ambulatory Visit: Payer: Self-pay | Admitting: *Deleted

## 2014-01-28 ENCOUNTER — Encounter: Payer: Self-pay | Admitting: Emergency Medicine

## 2014-01-28 DIAGNOSIS — Z01818 Encounter for other preprocedural examination: Secondary | ICD-10-CM

## 2014-02-05 ENCOUNTER — Ambulatory Visit
Admission: RE | Admit: 2014-02-05 | Discharge: 2014-02-05 | Disposition: A | Discharge status: Home / Self Care | Payer: 59 | Source: Ambulatory Visit | Attending: Cardiology | Admitting: Cardiology

## 2014-02-05 ENCOUNTER — Encounter (HOSPITAL_COMMUNITY): Payer: Self-pay

## 2014-02-05 DIAGNOSIS — R079 Chest pain, unspecified: Secondary | ICD-10-CM

## 2014-02-05 LAB — COMPREHENSIVE METABOLIC PANEL
ALT: 30 U/L (ref 0–53)
AST: 28 U/L (ref 0–37)
Albumin: 4.8 g/dL (ref 3.5–5.2)
Alkaline Phosphatase: 60 U/L (ref 39–117)
BILIRUBIN TOTAL: 0.8 mg/dL (ref 0.2–1.2)
BUN: 14 mg/dL (ref 6–23)
CO2: 29 meq/L (ref 19–32)
Calcium: 9.9 mg/dL (ref 8.4–10.5)
Chloride: 103 mEq/L (ref 96–112)
Creat: 0.83 mg/dL (ref 0.50–1.35)
GLUCOSE: 92 mg/dL (ref 70–99)
Potassium: 4.2 mEq/L (ref 3.5–5.3)
SODIUM: 140 meq/L (ref 135–145)
TOTAL PROTEIN: 6.9 g/dL (ref 6.0–8.3)

## 2014-02-05 LAB — CBC
HCT: 41.9 % (ref 39.0–52.0)
Hemoglobin: 14.8 g/dL (ref 13.0–17.0)
MCH: 31 pg (ref 26.0–34.0)
MCHC: 35.3 g/dL (ref 30.0–36.0)
MCV: 87.8 fL (ref 78.0–100.0)
Platelets: 197 10*3/uL (ref 150–400)
RBC: 4.77 MIL/uL (ref 4.22–5.81)
RDW: 13.4 % (ref 11.5–15.5)
WBC: 5 10*3/uL (ref 4.0–10.5)

## 2014-02-05 LAB — PROTIME-INR
INR: 0.98 (ref <1.50)
PROTHROMBIN TIME: 13 s (ref 11.6–15.2)

## 2014-02-05 LAB — APTT: APTT: 29 s (ref 24–37)

## 2014-02-10 ENCOUNTER — Telehealth: Payer: Self-pay | Admitting: Cardiovascular Disease

## 2014-02-10 NOTE — Telephone Encounter (Signed)
Left message on pt's home/work (only#listed for pt) [phone] informing pt heart cath for 02-11-14 was denied by his insurance [employer].  Dr. Tresa Endo did call [facility] and heart cath was denied.  Per Dr. Tresa Endo, if not approved cancel heart cath until we can get approved.  We will reschedule heart cath if decision is overturned through the appeal process.  Pt advised if further chest pain or other sxs to call 911 or go to ED.

## 2014-02-11 ENCOUNTER — Ambulatory Visit (HOSPITAL_COMMUNITY): Admission: RE | Admit: 2014-02-11 | Payer: 59 | Source: Ambulatory Visit | Admitting: Cardiovascular Disease

## 2014-02-11 ENCOUNTER — Telehealth: Payer: Self-pay | Admitting: Cardiology

## 2014-02-11 ENCOUNTER — Encounter (HOSPITAL_COMMUNITY): Admission: RE | Payer: Self-pay | Source: Ambulatory Visit

## 2014-02-11 SURGERY — LEFT HEART CATHETERIZATION WITH CORONARY ANGIOGRAM
Anesthesia: LOCAL

## 2014-02-11 NOTE — Telephone Encounter (Signed)
  Patient's left heart catheterization was initially denied by his insurance company, [facility] on 02/10/14. Additional documentation was sent by our office. The decision was overturned through the appeal process. I spoke personally with Dr. Boris Lown from [facility] who confirmed that the ordered procedure has now been approved. The following approval number was given:  [id number]  Robbie Lis, PA-C

## 2014-02-14 ENCOUNTER — Ambulatory Visit (HOSPITAL_COMMUNITY)
Admission: RE | Admit: 2014-02-14 | Discharge: 2014-02-14 | Disposition: A | Discharge status: Home / Self Care | Payer: 59 | Source: Ambulatory Visit | Attending: Cardiovascular Disease | Admitting: Cardiovascular Disease

## 2014-02-14 ENCOUNTER — Encounter (HOSPITAL_COMMUNITY)
Admission: RE | Disposition: A | Discharge status: Home / Self Care | Payer: Self-pay | Source: Ambulatory Visit | Attending: Cardiovascular Disease

## 2014-02-14 DIAGNOSIS — R0609 Other forms of dyspnea: Secondary | ICD-10-CM | POA: Diagnosis not present

## 2014-02-14 DIAGNOSIS — Z8249 Family history of ischemic heart disease and other diseases of the circulatory system: Secondary | ICD-10-CM | POA: Insufficient documentation

## 2014-02-14 DIAGNOSIS — Z87891 Personal history of nicotine dependence: Secondary | ICD-10-CM | POA: Insufficient documentation

## 2014-02-14 DIAGNOSIS — E785 Hyperlipidemia, unspecified: Secondary | ICD-10-CM | POA: Insufficient documentation

## 2014-02-14 DIAGNOSIS — R209 Unspecified disturbances of skin sensation: Secondary | ICD-10-CM | POA: Insufficient documentation

## 2014-02-14 DIAGNOSIS — R0989 Other specified symptoms and signs involving the circulatory and respiratory systems: Secondary | ICD-10-CM | POA: Insufficient documentation

## 2014-02-14 DIAGNOSIS — R079 Chest pain, unspecified: Secondary | ICD-10-CM | POA: Diagnosis present

## 2014-02-14 DIAGNOSIS — Z01818 Encounter for other preprocedural examination: Secondary | ICD-10-CM

## 2014-02-14 DIAGNOSIS — I10 Essential (primary) hypertension: Secondary | ICD-10-CM | POA: Diagnosis not present

## 2014-02-14 HISTORY — PX: LEFT HEART CATHETERIZATION WITH CORONARY ANGIOGRAM: SHX5451

## 2014-02-14 SURGERY — LEFT HEART CATHETERIZATION WITH CORONARY ANGIOGRAM
Anesthesia: LOCAL

## 2014-02-14 MED ORDER — FENTANYL CITRATE 0.05 MG/ML IJ SOLN
INTRAMUSCULAR | Status: AC
  Filled 2014-02-14: qty 2

## 2014-02-14 MED ORDER — DIAZEPAM 5 MG PO TABS
5.0000 mg | ORAL_TABLET | Freq: Once | ORAL | Status: AC
  Administered 2014-02-14: 5 mg via ORAL

## 2014-02-14 MED ORDER — HEPARIN (PORCINE) IN NACL 2-0.9 UNIT/ML-% IJ SOLN
INTRAMUSCULAR | Status: AC
Start: 2014-02-14 — End: 2014-02-14
  Filled 2014-02-14: qty 1500

## 2014-02-14 MED ORDER — ACETAMINOPHEN 325 MG PO TABS
ORAL_TABLET | ORAL | Status: AC
  Administered 2014-02-14: 650 mg via ORAL
  Filled 2014-02-14: qty 2

## 2014-02-14 MED ORDER — SODIUM CHLORIDE 0.9 % IV SOLN: INTRAVENOUS | Status: DC

## 2014-02-14 MED ORDER — HYDRALAZINE HCL 20 MG/ML IJ SOLN
INTRAMUSCULAR | Status: DC
Start: 2014-02-14 — End: 2014-02-14
  Filled 2014-02-14: qty 1

## 2014-02-14 MED ORDER — LIDOCAINE HCL (PF) 1 % IJ SOLN
INTRAMUSCULAR | Status: AC
  Filled 2014-02-14: qty 30

## 2014-02-14 MED ORDER — ASPIRIN 81 MG PO CHEW
CHEWABLE_TABLET | ORAL | Status: AC
  Filled 2014-02-14: qty 1

## 2014-02-14 MED ORDER — HEPARIN SODIUM (PORCINE) 1000 UNIT/ML IJ SOLN
INTRAMUSCULAR | Status: AC
  Filled 2014-02-14: qty 1

## 2014-02-14 MED ORDER — ASPIRIN 81 MG PO CHEW
81.0000 mg | CHEWABLE_TABLET | ORAL | Status: AC
  Administered 2014-02-14: 81 mg via ORAL

## 2014-02-14 MED ORDER — DIAZEPAM 5 MG PO TABS
ORAL_TABLET | ORAL | Status: AC
  Filled 2014-02-14: qty 1

## 2014-02-14 MED ORDER — MIDAZOLAM HCL 2 MG/2ML IJ SOLN
INTRAMUSCULAR | Status: AC
  Filled 2014-02-14: qty 2

## 2014-02-14 MED ORDER — DEXTROSE-NACL 5-0.45 % IV SOLN
INTRAVENOUS | Status: DC
  Administered 2014-02-14: 07:00:00 via INTRAVENOUS

## 2014-02-14 MED ORDER — SODIUM CHLORIDE 0.9 % IV SOLN: 250.0000 mL | INTRAVENOUS | Status: DC | PRN

## 2014-02-14 MED ORDER — SODIUM CHLORIDE 0.9 % IJ SOLN: 3.0000 mL | INTRAMUSCULAR | Status: DC | PRN

## 2014-02-14 MED ORDER — NITROGLYCERIN 1 MG/10 ML FOR IR/CATH LAB
INTRA_ARTERIAL | Status: AC
  Filled 2014-02-14: qty 10

## 2014-02-14 MED ORDER — SODIUM CHLORIDE 0.9 % IJ SOLN: 3.0000 mL | Freq: Two times a day (BID) | INTRAMUSCULAR | Status: DC

## 2014-02-14 MED ORDER — HYDRALAZINE HCL 20 MG/ML IJ SOLN
5.0000 mg | Freq: Once | INTRAMUSCULAR | Status: AC
  Administered 2014-02-14: 5 mg via INTRAVENOUS

## 2014-02-14 MED ORDER — DIAZEPAM 5 MG PO TABS
5.0000 mg | ORAL_TABLET | ORAL | Status: AC
  Administered 2014-02-14: 5 mg via ORAL

## 2014-02-14 MED ORDER — ONDANSETRON HCL 4 MG/2ML IJ SOLN
INTRAMUSCULAR | Status: AC
  Administered 2014-02-14: 4 mg via INTRAVENOUS
  Filled 2014-02-14: qty 2

## 2014-02-14 MED ORDER — ONDANSETRON HCL 4 MG/2ML IJ SOLN
4.0000 mg | Freq: Four times a day (QID) | INTRAMUSCULAR | Status: DC | PRN
  Administered 2014-02-14: 4 mg via INTRAVENOUS

## 2014-02-14 MED ORDER — ASPIRIN EC 81 MG PO TBEC: 81.0000 mg | DELAYED_RELEASE_TABLET | Freq: Every day | ORAL | Status: DC

## 2014-02-14 MED ORDER — VERAPAMIL HCL 2.5 MG/ML IV SOLN
INTRAVENOUS | Status: AC
  Filled 2014-02-14: qty 2

## 2014-02-14 MED ORDER — DIAZEPAM 5 MG PO TABS
ORAL_TABLET | ORAL | Status: AC
  Administered 2014-02-14: 5 mg via ORAL
  Filled 2014-02-14: qty 1

## 2014-02-14 MED ORDER — HYDRALAZINE HCL 20 MG/ML IJ SOLN: 5.0000 mg | Freq: Once | INTRAMUSCULAR | Status: DC

## 2014-02-14 MED ORDER — HYDRALAZINE HCL 20 MG/ML IJ SOLN
INTRAMUSCULAR | Status: AC
  Administered 2014-02-14: 5 mg via INTRAVENOUS
  Filled 2014-02-14: qty 1

## 2014-02-14 MED ORDER — ACETAMINOPHEN 325 MG PO TABS
650.0000 mg | ORAL_TABLET | ORAL | Status: DC | PRN
  Administered 2014-02-14: 650 mg via ORAL

## 2014-02-14 NOTE — Progress Notes (Signed)
Jones Skene, PA at bedside to evaluate pt. New orders noted and carried out.

## 2014-02-14 NOTE — CV Procedure (Signed)
Jeremiah [name] is a 48 y.o. male   [id number]  [id number] LOCATION:  FACILITY: [facility]  PHYSICIAN: Lennette Bihari, MD, [facility] [birth year]   DATE OF PROCEDURE:  02/14/2014     CARDIAC CATHETERIZATION    HISTORY:  Mr. [name] is a 48 year old male who has a history of hypertension and hyperlipidemia.  The patient has a very strong family history for coronary artery disease with his father suffering his first myocardial infarction at age 46, initial CABG surgery at age 95, and redo at age 41.  In 2006, nuclear stress test was equivocal, and definitive cardiac catheterization revealed normal coronary arteries.  Recently, the patient has had increasing social chest pain.  He admits to being under increased work-related stress.  He has had difficulty with blood pressure control.  He is now referred for definitive repeat cardiac catheterization.   PROCEDURE:  Left heart catheterization via the right radial approach: Coronary angiography, left ventriculography  The patient was brought to the second floor [facility] cath lab in the fasting state. The patient was premedicated with Versed 3 mg and fentanyl 75 mcg. A right radial approach was utilized after an Allen's test verified adequate circulation. The right radial artery was punctured via the Seldinger technique, and a 6 [place] Glidesheath Slender was inserted without difficulty.  A radial cocktail consisting of Verapamil, IV nitroglycerin, and lidocaine was administered. Weight adjusted heparin was administered. A safety J wire was advanced into the ascending aorta. Diagnostic catheterization was done with 5 [place] TIG  catheter. A 5 French pigtail catheter was used for left ventriculography. A TR radial band was applied for hemostasis. The patient left the catheterization laboratory in stable condition.   HEMODYNAMICS:   Central Aorta: 125/82  Left Ventricle: 125/12  ANGIOGRAPHY:   The left main coronary artery was  angiographically normal and bifurcated into the LAD and left circumflex coronary artery.   The LAD was angiographically normal and gave rise to 2 major diagonal vessels and several septal perforating arteries. The vessel extended to the LV apex.   The left circumflex coronary artery was anatomically normal and gave rise to one major bifurcating obtuse marginal branch.   The RCA was angiographically normal it gave rise to a large PDA and small PLA vessel.   Left ventriculography revealed normal global LV contractility without focal segmental wall motion abnormalities. There was no evidence for mitral regurgitation. EF is 55 - 60%.   Total contrast used: 75 cc Omnipaque   IMPRESSION:  Normal LV function.  Normal coronary arteries.   RECOMMENDATION:  Medical therapy with optimization of blood pressure control and lipid management.   Lennette Bihari, MD, [facility] 02/14/2014 8:33 AM

## 2014-02-14 NOTE — Progress Notes (Signed)
Pt reports N/V resolved. Right wrist stable.

## 2014-02-14 NOTE — Interval H&P Note (Signed)
Cath Lab Visit (complete for each Cath Lab visit)  Clinical Evaluation Leading to the Procedure:   ACS: No.  Non-ACS:    Anginal Classification: CCS III  Anti-ischemic medical therapy: Minimal Therapy (1 class of medications)  Non-Invasive Test Results: No non-invasive testing performed  Prior CABG: No previous CABG      History and Physical Interval Note:  02/14/2014 7:43 AM  Gus Height  has presented today for surgery, with the diagnosis of cp  The various methods of treatment have been discussed with the patient and family. After consideration of risks, benefits and other options for treatment, the patient has consented to  Procedure(s): LEFT HEART CATHETERIZATION WITH CORONARY ANGIOGRAM (N/A) as a surgical intervention .  The patient's history has been reviewed, patient examined, no change in status, stable for surgery.  I have reviewed the patient's chart and labs.  Questions were answered to the patient's satisfaction.     Firas Guardado,Endrit A

## 2014-02-14 NOTE — Progress Notes (Signed)
Pt c/o spasm like pain in right wrist and feeling nauseated. Dr. Tresa Endo paged.

## 2014-02-14 NOTE — H&P (View-Only) (Signed)
01/20/2014 Gus Height   1965/12/28  [id number]  Primary Physicia [name], Stan Head, MD Primary Cardiologist: Dr. Tresa Endo   HPI: The patient is a 48 y/o male, previously followed by Dr. Elsie Lincoln and now followed by Dr. Tresa Endo. He has not been seen since 2011. He was recently evaluated by his PCP, Dr. Cleta Alberts, who recommended that he f/u in our office for routine evaluation and consideration for a repeat LHC.   His history is significant for a strong family history of CAD. His father had his first MI at age 70, CABG at age 16 and redo CABG at age 61. The patient himself has a personal history of HTN and HLD. His simvastatin was recently discontinued and he was placed on Lipitor by his PCP due to an elevated LDL of 132.He also has a h/o prior tobacco use, but quit at age 60. He underwent a LHC by Dr. Elsie Lincoln in 2006 that demonstrated normal coronaries. He also had a 5 year f/u NST that was negative for ischemia and demonstrated normal LV function.   He is here today because he recently turned 48 and has concerns regarding his overall cardiovascular health given his family history, other cardiac risk factors and frequent symptoms of chest discomfort. He notes frequent left sided chest tightness radiating to his neck with bilateral upper extremity numbness and dyspnea. Symptoms offen occur at rest and at night wakening him from his sleep. Symptoms are also brought on my emotional stress. 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 He is unsure if his symptoms are cardiac related or due to stress/ anxiety attacks. His symptoms usually resolve spontaneously or after taking Valium. He is skeptical regarding undergoing further evaluation with a NST, because he reports having a false positive test in the past that was done in [place].     Current Outpatient Prescriptions  Medication Sig Dispense Refill  . atorvastatin (LIPITOR) 20 MG tablet Take 1 tablet (20 mg total) by  mouth daily.  30 tablet  11  . diazepam (VALIUM) 5 MG tablet TAKE 1 TABLET BY MOUTH EVERY DAY ONLY ON AN AS NEEDED BASIS  30 tablet  5  . famotidine (PEPCID) 20 MG tablet Take 20 mg by mouth 2 (two) times daily.      Marland Kitchen ibuprofen (ADVIL,MOTRIN) 200 MG tablet Take 200 mg by mouth every 6 (six) hours as needed.      Marland Kitchen losartan-hydrochlorothiazide (HYZAAR) 100-12.5 MG per tablet Take 1 tablet by mouth daily.  30 tablet  11  . zoster vaccine live, PF, (ZOSTAVAX) 75643 UNT/0.65ML injection Inject 19,400 Units into the skin once.  1 each  0  . ipratropium (ATROVENT) 0.06 % nasal spray Place 2 sprays into the nose 4 (four) times daily.  15 mL  1  . [DISCONTINUED] hydrochlorothiazide (HYDRODIURIL) 25 MG tablet Take 25 mg by mouth daily.       No current facility-administered medications for this visit.    No Known Allergies  History   Social History  . Marital Status: Single    Spouse Name: N/A    Number of Children: N/A  . Years of Education: N/A   Occupational History  . Not on file.   Social History Main Topics  . Smoking status: Former Smoker    Types: Cigarettes    Quit date: 07/02/1992  . Smokeless tobacco: Not on file     Comment: quit 10-15 yrs ago  .  Alcohol Use: Yes     Comment: 6-12 pack nightly-6-7 beers,occ vodka,drinks every night  . Drug Use: No  . Sexual Activity: Yes   Other Topics Concern  . Not on file   Social History Narrative  . No narrative on file     Review of Systems: General: negative for chills, fever, night sweats or weight changes.  Cardiovascular: negative for chest pain, dyspnea on exertion, edema, orthopnea, palpitations, paroxysmal nocturnal dyspnea or shortness of breath Dermatological: negative for rash Respiratory: negative for cough or wheezing Urologic: negative for hematuria Abdominal: negative for nausea, vomiting, diarrhea, bright red blood per rectum, melena, or hematemesis Neurologic: negative for visual changes, syncope, or  dizziness All other systems reviewed and are otherwise negative except as noted above.    Blood pressure 142/94, pulse 67, height  (1.753 m), weight 193 lb (87.544 kg).  General appearance: alert, cooperative and no distress Neck: no carotid bruit and no JVD Lungs: clear to auscultation bilaterally Heart: regular rate and rhythm, S1, S2 normal, no murmur, click, rub or gallop Extremities: no LEE Pulses: 2+ and symmetric Skin: warm and dry Neurologic: Grossly normal  EKG NSR 67 bpm  ASSESSMENT AND PLAN:   1. Chest Pain:  The patient is with multiple cardiac risk factors including a strong family h/o of CAD. He also has had frequent symptoms of what may be unstable angina, however symptoms also may be stress related/ due to anxiety. However, he wishes to forego risk stratification with a NST, because he had false results in the past. He is very concerned over his overall cardiovascular health, given his father's history, and he wishes to undergo definitive testing for reassurance. Given his family history, multiple risk factors including HTN, HLD and prior tobacco use, as well as symptoms that occur at rest, I feel that undergoing a repeat diagnostic LHC is appropriate at this time. Continue medical therapy for now, including daily ASA and statin. PRN SL NTG was prescribed.   2. Hypertension: Mildly elevated at 142/94. Followed by his PCP. Continue losartan-hydrochlorothiazide.  3. Hyperlipidemia: Last lipid panel in June 2015 revealed elevated LDL at 132 mg/dL. His simvastatin was recently discontinued and he is now on 20 mg of Lipitor daily. This is also followed by his PCP.    PLAN  Plan for diagnostic left heart catheterization with Dr. Tresa Endo at [facility]. Continue medical therapy as outlined above.  Jayley Hustead, BRITTAINYPA-C 01/20/2014 10:34 AM

## 2014-02-14 NOTE — Progress Notes (Signed)
Spoke with Harriett, RN cath lab supervisor to relay message to Dr. Tresa Endo regarding pt's pain in right wrist and BP elevation. New orders received and carried out. Will monitor.

## 2014-02-14 NOTE — Progress Notes (Signed)
Reported pt status to Dr. Tresa Endo. PRN zofran given per order. Will monitor.

## 2014-02-14 NOTE — Discharge Instructions (Signed)
Radial Site Care °Refer to this sheet in the next few weeks. These instructions provide you with information on caring for yourself after your procedure. Your caregiver may also give you more specific instructions. Your treatment has been planned according to current medical practices, but problems sometimes occur. Call your caregiver if you have any problems or questions after your procedure. °HOME CARE INSTRUCTIONS °· You may shower the day after the procedure. Remove the bandage (dressing) and gently wash the site with plain soap and water. Gently pat the site dry. °· Do not apply powder or lotion to the site. °· Do not submerge the affected site in water for 3 to 5 days. °· Inspect the site at least twice daily. °· Do not flex or bend the affected arm for 24 hours. °· No lifting over 5 pounds (2.3 kg) for 5 days after your procedure. °· Do not drive home if you are discharged the same day of the procedure. Have someone else drive you. °· You may drive 24 hours after the procedure unless otherwise instructed by your caregiver. °· Do not operate machinery or power tools for 24 hours. °· A responsible adult should be with you for the first 24 hours after you arrive home. °What to expect: °· Any bruising will usually fade within 1 to 2 weeks. °· Blood that collects in the tissue (hematoma) may be painful to the touch. It should usually decrease in size and tenderness within 1 to 2 weeks. °SEEK IMMEDIATE MEDICAL CARE IF: °· You have unusual pain at the radial site. °· You have redness, warmth, swelling, or pain at the radial site. °· You have drainage (other than a small amount of blood on the dressing). °· You have chills. °· You have a fever or persistent symptoms for more than 72 hours. °· You have a fever and your symptoms suddenly get worse. °· Your arm becomes pale, cool, tingly, or numb. °· You have heavy bleeding from the site. Hold pressure on the site. °Document Released: 06/18/2010 Document Revised:  08/08/2011 Document Reviewed: 06/18/2010 °ExitCare® Patient Information ©2015 [employer]. This information is not intended to replace advice given to you by your health care provider. Make sure you discuss any questions you have with your health care provider. ° °

## 2014-02-17 ENCOUNTER — Telehealth: Payer: Self-pay | Admitting: Cardiovascular Disease

## 2014-02-17 ENCOUNTER — Encounter: Payer: Self-pay | Admitting: Cardiovascular Disease

## 2014-02-17 NOTE — Telephone Encounter (Signed)
Received call from patient he stated he had a normal cath this past Friday 02/14/14.Stated today he has no energy feels life less.Advised he needs to see PCP.Post cath follow up scheduled with Norma Fredrickson NP 03/04/14 at 11:00 am at our [facility]. office.

## 2014-02-18 ENCOUNTER — Encounter: Payer: Self-pay | Admitting: Emergency Medicine

## 2014-02-18 ENCOUNTER — Ambulatory Visit (INDEPENDENT_AMBULATORY_CARE_PROVIDER_SITE_OTHER): Payer: 59 | Admitting: Emergency Medicine

## 2014-02-18 VITALS — BP 140/100 | HR 84 | Temp 98.6°F | Resp 16 | Ht 68.5 in | Wt 191.0 lb

## 2014-02-18 DIAGNOSIS — I10 Essential (primary) hypertension: Secondary | ICD-10-CM

## 2014-02-18 DIAGNOSIS — R5383 Other fatigue: Secondary | ICD-10-CM

## 2014-02-18 DIAGNOSIS — K2100 Gastro-esophageal reflux disease with esophagitis, without bleeding: Secondary | ICD-10-CM

## 2014-02-18 DIAGNOSIS — R0789 Other chest pain: Secondary | ICD-10-CM

## 2014-02-18 DIAGNOSIS — R5381 Other malaise: Secondary | ICD-10-CM

## 2014-02-18 LAB — CBC WITH DIFFERENTIAL/PLATELET
BASOS ABS: 0.1 10*3/uL (ref 0.0–0.1)
Basophils Relative: 1 % (ref 0–1)
EOS ABS: 0.1 10*3/uL (ref 0.0–0.7)
Eosinophils Relative: 1 % (ref 0–5)
HEMATOCRIT: 42.7 % (ref 39.0–52.0)
Hemoglobin: 15.4 g/dL (ref 13.0–17.0)
Lymphocytes Relative: 28 % (ref 12–46)
Lymphs Abs: 1.5 10*3/uL (ref 0.7–4.0)
MCH: 31.2 pg (ref 26.0–34.0)
MCHC: 36.1 g/dL — ABNORMAL HIGH (ref 30.0–36.0)
MCV: 86.4 fL (ref 78.0–100.0)
MONO ABS: 0.4 10*3/uL (ref 0.1–1.0)
Monocytes Relative: 8 % (ref 3–12)
Neutro Abs: 3.3 10*3/uL (ref 1.7–7.7)
Neutrophils Relative %: 62 % (ref 43–77)
PLATELETS: 196 10*3/uL (ref 150–400)
RBC: 4.94 MIL/uL (ref 4.22–5.81)
RDW: 12.8 % (ref 11.5–15.5)
WBC: 5.3 10*3/uL (ref 4.0–10.5)

## 2014-02-18 LAB — BASIC METABOLIC PANEL WITH GFR
BUN: 13 mg/dL (ref 6–23)
CALCIUM: 10.3 mg/dL (ref 8.4–10.5)
CHLORIDE: 103 meq/L (ref 96–112)
CO2: 28 meq/L (ref 19–32)
Creat: 0.88 mg/dL (ref 0.50–1.35)
GFR, Est African American: >89 mL/min
Glucose, Bld: 99 mg/dL (ref 70–99)
Potassium: 4.2 mEq/L (ref 3.5–5.3)
Sodium: 140 mEq/L (ref 135–145)

## 2014-02-18 LAB — TSH: TSH: 2.871 u[IU]/mL (ref 0.350–4.500)

## 2014-02-18 MED ORDER — OMEPRAZOLE 40 MG PO CPDR: 40.0000 mg | DELAYED_RELEASE_CAPSULE | Freq: Every day | ORAL | Status: DC

## 2014-02-18 MED ORDER — AMLODIPINE BESYLATE 5 MG PO TABS: 5.0000 mg | ORAL_TABLET | Freq: Every day | ORAL | Status: DC

## 2014-02-18 MED ORDER — DIAZEPAM 5 MG PO TABS: 5.0000 mg | ORAL_TABLET | Freq: Every day | ORAL | Status: DC | PRN

## 2014-02-18 NOTE — Progress Notes (Addendum)
   Subjective:    Patient ID: Jeremiah [name], male    DOB: [birth year], 48 y.o.   MRN: [id number] This chart was scribed for Collene Gobble, MD by Littie Deeds, ED Scribe. This patient was seen in room Room/bed 25 and the patient's care was started at 10:54 AM.   HPI HPI Comments: Jeremiah [name] is a 48 y.o. male with a Hx of GERD who presents to the [facility] complaining of BP concerns. Patient states his BP is still high and that his blood has trouble clotting. Patient had a recent catheterization 4 days ago but with no blockages. He notes that he had vomiting and constant right arm pain that still persists following the procedure. He says that he has had trouble with insurance for the procedure. Patient notes associated fatigue and says that he "feels dead". He has been taking Pepcid OTC for his GERD.   Review of Systems     Objective:   Physical Exam  CONSTITUTIONAL: Well developed/well nourished HEAD: Normocephalic/atraumatic EYES: EOM/PERRL ENMT: Mucous membranes moist NECK: supple no meningeal signs SPINE: entire spine nontender CV: S1/S2 noted, no murmurs/rubs/gallops noted, puncture site over right radial artery with bruising, normal capillary refill, BP measured at 142/102 LUNGS: Lungs are clear to auscultation bilaterally, no apparent distress ABDOMEN: soft, nontender, no rebound or guarding GU: no cva tenderness NEURO: Pt is awake/alert, moves all extremitiesx4 EXTREMITIES: pulses normal, full ROM SKIN: warm, color normal PSYCH: no abnormalities of mood noted EKG NSR     Assessment & Plan:  Patient having a lot of difficulty with fatigue. We'll check a basic metabolic panel to be sure his renal function remains normal. Blood pressure continues to be elevated and we'll add amlodipine 5 mg a day. He has a followup appointment with cardiologist in about 10 days. He also has had increasing problems with reflux at night .Will add omeprazole 40 mg one a  day . Consideration to be given to a possible sleep study if fatigue continues to be an issue and he does not respond well to omeprazole. His diazepam prescription was refilled

## 2014-03-04 ENCOUNTER — Ambulatory Visit (INDEPENDENT_AMBULATORY_CARE_PROVIDER_SITE_OTHER): Payer: 59 | Admitting: Nurse Practitioner

## 2014-03-04 ENCOUNTER — Encounter: Payer: Self-pay | Admitting: Nurse Practitioner

## 2014-03-04 VITALS — BP 130/86 | HR 85 | Ht 69.0 in | Wt 195.4 lb

## 2014-03-04 DIAGNOSIS — E785 Hyperlipidemia, unspecified: Secondary | ICD-10-CM

## 2014-03-04 DIAGNOSIS — Z9889 Other specified postprocedural states: Secondary | ICD-10-CM

## 2014-03-04 DIAGNOSIS — I10 Essential (primary) hypertension: Secondary | ICD-10-CM

## 2014-03-04 NOTE — Progress Notes (Signed)
Jeremiah [name] Date of Birth: 1966-03-21 Medical Record #[id number]  History of Present Illness: Jeremiah [name] is seen back today for a post cath visit. The patient is a 48 y/o male, previously followed by Jeremiah [name] and now followed by Jeremiah [name].   His history is significant for a strong family history of CAD. His father had his first MI at age 48, CABG at age 106 and redo CABG at age 58. The patient himself has a personal history of HTN and HLD. His simvastatin was recently discontinued and he was placed on Lipitor by his PCP due to an elevated LDL of 132.He also has a h/o prior tobacco use, but quit at age 81. He underwent a LHC by Jeremiah [name] in 2006 that demonstrated normal coronaries. He also had a 5 year f/u NST that was negative for ischemia and demonstrated normal LV function.   Referred back here from primary care - had concerns regarding his overall cardiovascular health given his family history, other cardiac risk factors and frequent symptoms of chest discomfort. He endorsed frequent left sided chest tightness radiating to his neck with bilateral upper extremity numbness and dyspnea. Symptoms offen occur at rest and at night wakening him from his sleep. Symptoms are also brought on my emotional stress. 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 Apparently has had a false positive stress test in the past that was done in [place]. He was referred for cardiac cath which was done about 3 weeks ago with Jeremiah [name] - results noted below.  Comes back here today. Here alone. Feels "lifeless". Has been back to his PCP - started on Norvasc for his elevated BP. Valium refilled. PPI started. He says he has started feeling better in the past week or so. BP has improved. No chest pain. Lots of stress with his work life. Admits that he drinks too much - says he is a "functional alcoholic". Not really interested in stopping at this time. Diet not ideal. No routine  exercise. Not interested in getting a sleep study at this time.   Current Outpatient Prescriptions  Medication Sig Dispense Refill  . amLODipine (NORVASC) 5 MG tablet Take 1 tablet (5 mg total) by mouth daily.  30 tablet  11  . atorvastatin (LIPITOR) 20 MG tablet Take 1 tablet (20 mg total) by mouth daily.  30 tablet  11  . diazepam (VALIUM) 5 MG tablet Take 1 tablet (5 mg total) by mouth daily as needed for anxiety.  30 tablet  3  . famotidine (PEPCID) 20 MG tablet Take 20 mg by mouth 2 (two) times daily.      Marland Kitchen ibuprofen (ADVIL,MOTRIN) 200 MG tablet Take 200 mg by mouth every 6 (six) hours as needed for mild pain.       Marland Kitchen losartan-hydrochlorothiazide (HYZAAR) 100-12.5 MG per tablet Take 1 tablet by mouth daily.  30 tablet  11  . nitroGLYCERIN (NITROSTAT) 0.4 MG SL tablet Place 1 tablet (0.4 mg total) under the tongue every 5 (five) minutes as needed for chest pain.  25 tablet  3  . omeprazole (PRILOSEC) 40 MG capsule Take 1 capsule (40 mg total) by mouth daily.  30 capsule  3  . zoster vaccine live, PF, (ZOSTAVAX) 40981 UNT/0.65ML injection Inject 19,400 Units into the skin once.  1 each  0  . [DISCONTINUED] hydrochlorothiazide (HYDRODIURIL) 25 MG tablet Take 25 mg by mouth daily.  No current facility-administered medications for this visit.    No Known Allergies  Past Medical History  Diagnosis Date  . Essential hypertension, benign   . Depression   . Reflux   . Hyperlipidemia   . Alcohol dependence   . Hyperglycemia   . HSV-2 (herpes simplex virus 2) infection   . Prostatitis   . Anxiety   . History of heavy alcohol consumption     Past Surgical History  Procedure Laterality Date  . Shoulder surgery  1983    left  . Cardiac catheterization  2006  . Orif finger fracture  2009    rt ring  . Cholecystectomy  1990  . Shoulder arthroscopy with subacromial decompression, rotator cuff repair and bicep tendon repair Right 07/09/2013    Procedure: RIGHT SHOULDER ARTHROSCOPY  WITH SUBACROMIAL DECOMPRESSION, DEBRIDEMENT,  SUBSCAPULARIS TENDON REPAIR;  Surgeon: Jeremiah Forsterobert V Sypher Jr., MD;  Location: [facility];  Service: Orthopedics;  Laterality: Right;  . Doppler echocardiography  11/2005  . Cardiovascular stress test  07/2011    History  Smoking status  . Former Smoker  . Types: Cigarettes  . Quit date: 07/02/1992  Smokeless tobacco  . Not on file    Comment: quit 10-15 yrs ago    History  Alcohol Use  . Yes    Comment: 6-12 pack nightly-6-7 beers,occ vodka,drinks every night    Family History  Problem Relation Age of Onset  . Cancer Mother     breast  . Hyperlipidemia Mother   . Hypertension Mother   . Hyperlipidemia Father   . Hypertension Father   . Heart disease Father   . Hyperlipidemia Sister   . Hypertension Sister   . Stroke Maternal Grandmother   . Heart attack Maternal Grandmother   . Heart attack Maternal Grandfather   . Hyperlipidemia Sister   . Hypertension Sister     Review of Systems: The review of systems is per the HPI.  All other systems were reviewed and are negative.  Physical Exam: BP 130/86  Pulse 85  Ht 5\' 9"  (1.753 m)  Wt 195 lb 6.4 oz (88.633 kg)  BMI 28.84 kg/m2  SpO2 98% Patient is pleasant and in no acute distress. Hyper with his speech. Skin is warm and dry. Color is normal.  HEENT is unremarkable. Normocephalic/atraumatic. PERRL. Sclera are nonicteric. Neck is supple. No masses. No JVD. Lungs are clear. Cardiac exam shows a regular rate and rhythm. Abdomen is soft. Extremities are without edema. Gait and ROM are intact. No gross neurologic deficits noted.  Wt Readings from Last 3 Encounters:  03/04/14 195 lb 6.4 oz (88.633 kg)  02/18/14 191 lb (86.637 kg)  02/14/14 192 lb (87.091 kg)    LABORATORY DATA/PROCEDURES:  Lab Results  Component Value Date   WBC 5.3 02/18/2014   HGB 15.4 02/18/2014   HCT 42.7 02/18/2014   PLT 196 02/18/2014   GLUCOSE 99 02/18/2014   CHOL 209* 10/29/2013   TRIG 106  10/29/2013   HDL 56 10/29/2013   LDLCALC 161132* 10/29/2013   ALT 30 02/05/2014   AST 28 02/05/2014   NA 140 02/18/2014   K 4.2 02/18/2014   CL 103 02/18/2014   CREATININE 0.88 02/18/2014   BUN 13 02/18/2014   CO2 28 02/18/2014   TSH 2.871 02/18/2014   PSA 1.19 10/29/2013   INR 0.98 02/05/2014    BNP (last 3 results) No results found for this basename: PROBNP,  in the last 8760 hours  CARDIAC  CATHETERIZATION  HISTORY: Mr. Gustaf Mccarter is a 48 year old male who has a history of hypertension and hyperlipidemia. The patient has a very strong family history for coronary artery disease with his father suffering his first myocardial infarction at age 59, initial CABG surgery at age 53, and redo at age 4. In 2006, nuclear stress test was equivocal, and definitive cardiac catheterization revealed normal coronary arteries. Recently, the patient has had increasing social chest pain. He admits to being under increased work-related stress. He has had difficulty with blood pressure control. He is now referred for definitive repeat cardiac catheterization.  PROCEDURE: Left heart catheterization via the right radial approach: Coronary angiography, left ventriculography  The patient was brought to the second floor [facility] lab in the fasting state. The patient was premedicated with Versed 3 mg and fentanyl 75 mcg. A right radial approach was utilized after an Allen's test verified adequate circulation. The right radial artery was punctured via the Seldinger technique, and a 6 [place] Glidesheath Slender was inserted without difficulty. A radial cocktail consisting of Verapamil, IV nitroglycerin, and lidocaine was administered. Weight adjusted heparin was administered. A safety J wire was advanced into the ascending aorta. Diagnostic catheterization was done with 5 [place] TIG catheter. A 5 French pigtail catheter was used for left ventriculography. A TR radial band was applied for hemostasis. The patient left the  catheterization laboratory in stable condition.  HEMODYNAMICS:  Central Aorta: 125/82  Left Ventricle: 125/12  ANGIOGRAPHY:  The left main coronary artery was angiographically normal and bifurcated into the LAD and left circumflex coronary artery.  The LAD was angiographically normal and gave rise to 2 major diagonal vessels and several septal perforating arteries. The vessel extended to the LV apex.  The left circumflex coronary artery was anatomically normal and gave rise to one major bifurcating obtuse marginal branch.  The RCA was angiographically normal it gave rise to a large PDA and small PLA vessel.  Left ventriculography revealed normal global LV contractility without focal segmental wall motion abnormalities. There was no evidence for mitral regurgitation. EF is 55 - 60%.  Total contrast used: 75 cc Omnipaque  IMPRESSION:  Normal LV function.  Normal coronary arteries.  RECOMMENDATION:  Medical therapy with optimization of blood pressure control and lipid management.  Lennette Bihari, MD, [facility]  02/14/2014  8:33 AM  Assessment / Plan: 1. S/P cardiac cath - normal coronaries and normal LV function  2. HTN -  BP has improved.   3. HLD - on statin  4. Alcohol abuse - not ready to stop/change.   5. Fatigue - most likely multifactorial.   Needs to work on CV risk factors and just general health measures.  See back prn. Otherwise defer back to PCP.   Patient is agreeable to this plan and will call if any problems develop in the interim.   Rosalio Macadamia, RN, ANP-C [facility] [address] [place], [place]  [address] [phone]

## 2014-03-04 NOTE — Patient Instructions (Signed)
Stay on your current medicines  Call the [facility] office at [phone] if you have any questions, problems or concerns.

## 2014-03-11 ENCOUNTER — Ambulatory Visit: Payer: 59 | Admitting: Emergency Medicine

## 2014-03-18 ENCOUNTER — Ambulatory Visit: Payer: 59 | Admitting: Emergency Medicine

## 2014-04-29 ENCOUNTER — Other Ambulatory Visit: Payer: Self-pay | Admitting: Emergency Medicine

## 2014-05-08 ENCOUNTER — Encounter (HOSPITAL_COMMUNITY): Payer: Self-pay | Admitting: Cardiovascular Disease

## 2014-05-18 ENCOUNTER — Ambulatory Visit (INDEPENDENT_AMBULATORY_CARE_PROVIDER_SITE_OTHER): Payer: 59

## 2014-05-18 ENCOUNTER — Other Ambulatory Visit: Payer: Self-pay | Admitting: Emergency Medicine

## 2014-05-18 ENCOUNTER — Ambulatory Visit (INDEPENDENT_AMBULATORY_CARE_PROVIDER_SITE_OTHER): Payer: 59 | Admitting: Emergency Medicine

## 2014-05-18 VITALS — BP 138/88 | HR 83 | Temp 98.2°F | Resp 16 | Ht 69.0 in | Wt 190.6 lb

## 2014-05-18 DIAGNOSIS — R0781 Pleurodynia: Secondary | ICD-10-CM

## 2014-05-18 MED ORDER — IBUPROFEN 600 MG PO TABS: ORAL_TABLET | ORAL | Status: DC

## 2014-05-18 NOTE — Progress Notes (Signed)
Subjective:    Patient ID: Jeremiah [name], male    DOB: 07/23/1965, 48 y.o.   MRN: [id number]  HPI Chief Complaint  Patient presents with  . Back Pain    x 1 month   This chart was scribed for Jeremiah ChrisSteven Meribeth Vitug, MD by Andrew Auaven Small, ED Scribe. This patient was seen in room 1 and the patient's care was started at 11:18 AM.  HPI Comments: Jeremiah Jeremiah [name] is a 48 y.o. male who presents to the [facility] complaining of unchanged left lower rib pain onset 1 month. Pt denies falling or recent injuries but reports loading heavy objects. Pt states he is soon to take a 12 hour car ride to [place] for the holidays.   Past Medical History  Diagnosis Date  . Essential hypertension, benign   . Depression   . Reflux   . Hyperlipidemia   . Alcohol dependence   . Hyperglycemia   . HSV-2 (herpes simplex virus 2) infection   . Prostatitis   . Anxiety   . History of heavy alcohol consumption    No Known Allergies Prior to Admission medications   Medication Sig Start Date End Date Taking? Authorizing Provider  amLODipine (NORVASC) 5 MG tablet Take 1 tablet (5 mg total) by mouth daily. 02/18/14  Yes Collene GobbleSteven A Ania Levay, MD  atorvastatin (LIPITOR) 20 MG tablet Take 1 tablet (20 mg total) by mouth daily. 10/30/13  Yes Collene GobbleSteven A Kahlil Cowans, MD  diazepam (VALIUM) 5 MG tablet Take 1 tablet (5 mg total) by mouth daily as needed for anxiety. 02/18/14  Yes Collene GobbleSteven A Gilberto Stanforth, MD  famotidine (PEPCID) 20 MG tablet Take 20 mg by mouth 2 (two) times daily.   Yes Historical Provider, MD  ibuprofen (ADVIL,MOTRIN) 200 MG tablet Take 200 mg by mouth every 6 (six) hours as needed for mild pain.    Yes Historical Provider, MD  losartan-hydrochlorothiazide (HYZAAR) 100-12.5 MG per tablet Take 1 tablet by mouth daily. 10/29/13  Yes Collene GobbleSteven A Ellenora Talton, MD  nitroGLYCERIN (NITROSTAT) 0.4 MG SL tablet Place 1 tablet (0.4 mg total) under the tongue every 5 (five) minutes as needed for chest pain. 01/20/14  Yes Brittainy Sherlynn CarbonM Simmons, PA-C   omeprazole (PRILOSEC) 40 MG capsule TAKE ONE CAPSULE BY MOUTH DAILY 04/30/14  Yes Collene GobbleSteven A Marili Vader, MD  zoster vaccine live, PF, (ZOSTAVAX) [id number] UNT/0.65ML injection Inject 19,400 Units into the skin once. 10/29/13  Yes Collene GobbleSteven A Neema Barreira, MD   Review of Systems  Musculoskeletal: Positive for myalgias and arthralgias.    Objective:   Physical Exam CONSTITUTIONAL: Well developed/well nourished HEAD: Normocephalic/atraumatic EYES: EOMI/PERRL ENMT: Mucous membranes moist NECK: supple no meningeal signs SPINE/BACK:entire spine nontender CV: S1/S2 noted, no murmurs/rubs/gallops noted LUNGS: Lungs are clear to auscultation bilaterally, no apparent distress. Tender over left lateral ribs 9 10 11th rib area ABDOMEN: soft, nontender, no rebound or guarding, bowel sounds noted throughout abdomen. No upper abdominal tenderness.  GU:no cva tenderness NEURO: Pt is awake/alert/appropriate, moves all extremitiesx4.  No facial droop.   EXTREMITIES: pulses normal/equal, full ROM SKIN: warm, color normal PSYCH: no abnormalities of mood noted, alert and oriented to situation  [facility] reading (PRIMARY) by Dr. Cleta Albertsaub CXR. No fracture seen. No pneumothorax   Assessment & Plan:  Patient will take ibuprofen for his rib pain. Prescription for ibuprofen was sent to the pharmacy. A flu shot was given.I personally performed the services described in this documentation, which was scribed in my presence. The recorded information has  been reviewed and is accurate.

## 2014-05-18 NOTE — Patient Instructions (Signed)
Costochondritis Costochondritis, sometimes called Tietze syndrome, is a swelling and irritation (inflammation) of the tissue (cartilage) that connects your ribs with your breastbone (sternum). It causes pain in the chest and rib area. Costochondritis usually goes away on its own over time. It can take up to 6 weeks or longer to get better, especially if you are unable to limit your activities. CAUSES  Some cases of costochondritis have no known cause. Possible causes include:  Injury (trauma).  Exercise or activity such as lifting.  Severe coughing. SIGNS AND SYMPTOMS  Pain and tenderness in the chest and rib area.  Pain that gets worse when coughing or taking deep breaths.  Pain that gets worse with specific movements. DIAGNOSIS  Your health care provider will do a physical exam and ask about your symptoms. Chest X-rays or other tests may be done to rule out other problems. TREATMENT  Costochondritis usually goes away on its own over time. Your health care provider may prescribe medicine to help relieve pain. HOME CARE INSTRUCTIONS   Avoid exhausting physical activity. Try not to strain your ribs during normal activity. This would include any activities using chest, abdominal, and side muscles, especially if heavy weights are used.  Apply ice to the affected area for the first 2 days after the pain begins.  Put ice in a plastic bag.  Place a towel between your skin and the bag.  Leave the ice on for 20 minutes, 2-3 times a day.  Only take over-the-counter or prescription medicines as directed by your health care provider. SEEK MEDICAL CARE IF:  You have redness or swelling at the rib joints. These are signs of infection.  Your pain does not go away despite rest or medicine. SEEK IMMEDIATE MEDICAL CARE IF:   Your pain increases or you are very uncomfortable.  You have shortness of breath or difficulty breathing.  You cough up blood.  You have worse chest pains,  sweating, or vomiting.  You have a fever or persistent symptoms for more than 2-3 days.  You have a fever and your symptoms suddenly get worse. MAKE SURE YOU:   Understand these instructions.  Will watch your condition.  Will get help right away if you are not doing well or get worse. Document Released: 02/23/2005 Document Revised: 03/06/2013 Document Reviewed: 12/18/2012 ExitCare Patient Information 2015 [employer]. This information is not intended to replace advice given to you by your health care provider. Make sure you discuss any questions you have with your health care provider.  

## 2014-07-01 ENCOUNTER — Other Ambulatory Visit: Payer: Self-pay | Admitting: Emergency Medicine

## 2014-07-31 ENCOUNTER — Other Ambulatory Visit: Payer: Self-pay | Admitting: Emergency Medicine

## 2014-08-05 ENCOUNTER — Other Ambulatory Visit: Payer: Self-pay | Admitting: Emergency Medicine

## 2014-09-29 ENCOUNTER — Ambulatory Visit (INDEPENDENT_AMBULATORY_CARE_PROVIDER_SITE_OTHER): Payer: 59 | Admitting: Emergency Medicine

## 2014-09-29 DIAGNOSIS — Z202 Contact with and (suspected) exposure to infections with a predominantly sexual mode of transmission: Secondary | ICD-10-CM

## 2014-09-29 DIAGNOSIS — E782 Mixed hyperlipidemia: Secondary | ICD-10-CM | POA: Diagnosis not present

## 2014-09-29 MED ORDER — AMLODIPINE BESYLATE 5 MG PO TABS: 5.0000 mg | ORAL_TABLET | Freq: Every day | ORAL | Status: DC

## 2014-09-29 MED ORDER — ATORVASTATIN CALCIUM 20 MG PO TABS: 20.0000 mg | ORAL_TABLET | Freq: Every day | ORAL | Status: DC

## 2014-09-29 MED ORDER — OMEPRAZOLE 40 MG PO CPDR: 40.0000 mg | DELAYED_RELEASE_CAPSULE | Freq: Every day | ORAL | Status: DC

## 2014-09-29 MED ORDER — VALACYCLOVIR HCL 1 G PO TABS: ORAL_TABLET | ORAL | Status: DC

## 2014-09-29 NOTE — Progress Notes (Addendum)
Subjective:  This chart was scribed for Jeremiah GobbleSteven A Candiss Galeana, MD by Jeremiah [name], ED Scribe. The patient was seen in room 9. Patient's care was started at 1:21 PM.   Patient ID: Jeremiah [name], male    DOB: 12/19/1965, 49 y.o.   MRN: [id number]  Chief Complaint  Patient presents with  . Follow-up  . Hypertension  . Medication Refill    norvasc, losartan   HPI HPI Comments: Jeremiah [name] is a 49 y.o. male, with a h/o HTN, hyperlipidemia, hyperglycemia, depression, anxiety, who presents to the [facility] for a medication refill of Norvasc and Losartan.   Visual Changes  Pt has noticed a decline in his vision over the past few months. He reports difficulty seeing near objects but denies complications with objects that are far away. He states that he has had to move his phone away from his face just to read messages.   Prostate Pt reports intermittent pressure in his prostate for the past few weeks. Pt's last PSA was 1.19 on 10/29/2013. He had his colonoscopy and prostate exam when he turned 48; normal. He also had a cardiac catheterization on 02/14/14 by cardiologist Nicki Guadalajarahomas Kelly; normal. Patient did have an episode of unprotected sex about 2 weeks ago. He feels the pain in his prostate area and some discomfort at the tip of his penis  Pt plans to move back to [place] in 1-2 years. He has been to [place] x4-5 in the past 2 months to visit his mother who has been ill since her 3 week visit in [place].   Past Medical History  Diagnosis Date  . Essential hypertension, benign   . Depression   . Reflux   . Hyperlipidemia   . Alcohol dependence   . Hyperglycemia   . HSV-2 (herpes simplex virus 2) infection   . Prostatitis   . Anxiety   . History of heavy alcohol consumption    Current Outpatient Prescriptions on File Prior to Visit  Medication Sig Dispense Refill  . amLODipine (NORVASC) 5 MG tablet Take 1 tablet (5 mg total) by mouth daily. 30 tablet 11  . atorvastatin  (LIPITOR) 20 MG tablet Take 1 tablet (20 mg total) by mouth daily. 30 tablet 11  . diazepam (VALIUM) 5 MG tablet TAKE 1 TABLET BY MOUTH DAILY AS NEEDED FOR ANXIETY 90 tablet 3  . famotidine (PEPCID) 20 MG tablet Take 20 mg by mouth 2 (two) times daily.    Marland Kitchen. losartan-hydrochlorothiazide (HYZAAR) 100-12.5 MG per tablet Take 1 tablet by mouth daily. 30 tablet 11  . omeprazole (PRILOSEC) 40 MG capsule TAKE 1 CAPSULE BY MOUTH DAILY 90 capsule 0  . ibuprofen (ADVIL,MOTRIN) 200 MG tablet Take 200 mg by mouth every 6 (six) hours as needed for mild pain.     Marland Kitchen. ibuprofen (ADVIL,MOTRIN) 600 MG tablet Take 1 every 8 hours as needed for back pain. (Patient not taking: Reported on 09/29/2014) 40 tablet 0  . nitroGLYCERIN (NITROSTAT) 0.4 MG SL tablet Place 1 tablet (0.4 mg total) under the tongue every 5 (five) minutes as needed for chest pain. (Patient not taking: Reported on 09/29/2014) 25 tablet 3  . zoster vaccine live, PF, (ZOSTAVAX) [id number] UNT/0.65ML injection Inject 19,400 Units into the skin once. (Patient not taking: Reported on 09/29/2014) 1 each 0  . [DISCONTINUED] hydrochlorothiazide (HYDRODIURIL) 25 MG tablet Take 25 mg by mouth daily.     No current facility-administered medications on file prior to visit.   No  Known Allergies  Review of Systems  Eyes: Positive for visual disturbance.   There were no vitals taken for this visit.    Objective:   Physical Exam CONSTITUTIONAL: Well developed/well nourished HEAD: Normocephalic/atraumatic EYES: EOMI/PERRL ENMT: Mucous membranes moist NECK: supple no meningeal signs SPINE/BACK:entire spine nontender CV: S1/S2 noted, no murmurs/rubs/gallops noted LUNGS: Lungs are clear to auscultation bilaterally, no apparent distress ABDOMEN: soft, nontender, no rebound or guarding, bowel sounds noted throughout abdomen GU:no cva tenderness, mild tenderness over the prostate, no urethral discharge  NEURO: Pt is awake/alert/appropriate, moves all extremitiesx4. No  facial droop.   EXTREMITIES: pulses normal/equal, full ROM SKIN: warm, color normal PSYCH: no abnormalities of mood noted, alert and oriented to situation    Meds ordered this encounter  Medications  . atorvastatin (LIPITOR) 20 MG tablet    Sig: Take 1 tablet (20 mg total) by mouth daily.    Dispense:  90 tablet    Refill:  3  . amLODipine (NORVASC) 5 MG tablet    Sig: Take 1 tablet (5 mg total) by mouth daily.    Dispense:  90 tablet    Refill:  3  . omeprazole (PRILOSEC) 40 MG capsule    Sig: Take 1 capsule (40 mg total) by mouth daily.    Dispense:  90 capsule    Refill:  3    **Patient requests 90 days supply**  . valACYclovir (VALTREX) 1000 MG tablet    Sig: One by mouth twice a day for 3 days    Dispense:  20 tablet    Refill:  1   Assessment & Plan:  Physical exam is normal. He will be on Valtrex 1 twice a day for 3 days. Blood pressure medications were refilled. He was instructed to cut back on his alcohol.I personally performed the services described in this documentation, which was scribed in my presence. The recorded information has been reviewed and is accurate.

## 2014-09-30 LAB — GC/CHLAMYDIA PROBE AMP
CT PROBE, AMP APTIMA: NEGATIVE
GC Probe RNA: NEGATIVE

## 2014-09-30 LAB — RPR

## 2014-09-30 LAB — HIV ANTIBODY (ROUTINE TESTING W REFLEX): HIV 1&2 Ab, 4th Generation: NONREACTIVE

## 2014-10-05 ENCOUNTER — Other Ambulatory Visit: Payer: Self-pay | Admitting: Emergency Medicine

## 2014-11-03 ENCOUNTER — Other Ambulatory Visit: Payer: Self-pay | Admitting: Emergency Medicine

## 2014-11-07 NOTE — Telephone Encounter (Signed)
Patient is requesting a refill of Losartan-hydrochlorothiazide (HYZAAR) 100-12.5 MG per tablet He has been out for a week of this medication.

## 2015-02-04 ENCOUNTER — Encounter: Payer: Self-pay | Admitting: Cardiovascular Disease

## 2015-03-03 ENCOUNTER — Encounter: Payer: Self-pay | Admitting: Emergency Medicine

## 2015-03-06 ENCOUNTER — Ambulatory Visit (INDEPENDENT_AMBULATORY_CARE_PROVIDER_SITE_OTHER): Payer: 59 | Admitting: Emergency Medicine

## 2015-03-06 VITALS — BP 116/70 | HR 79 | Temp 98.4°F | Resp 14 | Ht 69.0 in | Wt 193.0 lb

## 2015-03-06 DIAGNOSIS — Z23 Encounter for immunization: Secondary | ICD-10-CM

## 2015-03-06 DIAGNOSIS — J301 Allergic rhinitis due to pollen: Secondary | ICD-10-CM

## 2015-03-06 DIAGNOSIS — F439 Reaction to severe stress, unspecified: Secondary | ICD-10-CM

## 2015-03-06 DIAGNOSIS — Z202 Contact with and (suspected) exposure to infections with a predominantly sexual mode of transmission: Secondary | ICD-10-CM | POA: Diagnosis not present

## 2015-03-06 DIAGNOSIS — Z658 Other specified problems related to psychosocial circumstances: Secondary | ICD-10-CM

## 2015-03-06 DIAGNOSIS — R3915 Urgency of urination: Secondary | ICD-10-CM

## 2015-03-06 LAB — POCT CBC
Granulocyte percent: 73.3 %G (ref 37–80)
HCT, POC: 45.6 % (ref 43.5–53.7)
Hemoglobin: 15.4 g/dL (ref 14.1–18.1)
LYMPH, POC: 1.3 (ref 0.6–3.4)
MCH, POC: 30.1 pg (ref 27–31.2)
MCHC: 33.7 g/dL (ref 31.8–35.4)
MCV: 89.3 fL (ref 80–97)
MID (cbc): 0.4 (ref 0–0.9)
MPV: 7.4 fL (ref 0–99.8)
POC Granulocyte: 4.8 (ref 2–6.9)
POC LYMPH PERCENT: 19.9 %L (ref 10–50)
POC MID %: 6.8 %M (ref 0–12)
Platelet Count, POC: 195 10*3/uL (ref 142–424)
RBC: 5.11 M/uL (ref 4.69–6.13)
RDW, POC: 12.8 %
WBC: 6.6 10*3/uL (ref 4.6–10.2)

## 2015-03-06 MED ORDER — IBUPROFEN 600 MG PO TABS: ORAL_TABLET | ORAL | Status: DC

## 2015-03-06 MED ORDER — VALACYCLOVIR HCL 1 G PO TABS: ORAL_TABLET | ORAL | Status: DC

## 2015-03-06 MED ORDER — DOXYCYCLINE HYCLATE 100 MG PO CAPS: 100.0000 mg | ORAL_CAPSULE | Freq: Two times a day (BID) | ORAL | Status: DC

## 2015-03-06 MED ORDER — DIAZEPAM 5 MG PO TABS: 5.0000 mg | ORAL_TABLET | Freq: Every day | ORAL | Status: DC | PRN

## 2015-03-06 NOTE — Progress Notes (Signed)
Patient ID: Jeremiah [name], male   DOB: Oct 18, 1965, 49 y.o.   MRN: [id number]     This chart was scribed for Lesle Chris, MD by Littie Deeds, Medical Scribe. This patient was seen in room 4 and the patient's care was started at 12:04 PM.   Chief Complaint:  Chief Complaint  Patient presents with  . Nasal Congestion  . chest congestion  . Sinusitis  . Immunizations    Flu vaccine    HPI: Jeremiah [name] is a 49 y.o. male with a history of hypertension who reports to [facility] today complaining of gradual onset URI symptoms that started 3 days ago. Patient reports having associated congestion.  Patient also reports having some urinary urgency and urinary frequency with associated pressure and burning pain to his prostate area over the past 2 weeks. He has been dealing with a lot of stress and thinks his symptoms may be related to stress. He quit smoking about 20 years ago. He does admit to alcohol use, about 2-3 drinks per night.   Past Medical History  Diagnosis Date  . Essential hypertension, benign   . Depression   . Reflux   . Hyperlipidemia   . Alcohol dependence (HCC)   . Hyperglycemia   . HSV-2 (herpes simplex virus 2) infection   . Prostatitis   . Anxiety   . History of heavy alcohol consumption    Past Surgical History  Procedure Laterality Date  . Shoulder surgery  1983    left  . Cardiac catheterization  2006  . Orif finger fracture  2009    rt ring  . Cholecystectomy  1990  . Shoulder arthroscopy with subacromial decompression, rotator cuff repair and bicep tendon repair Right 07/09/2013    Procedure: RIGHT SHOULDER ARTHROSCOPY WITH SUBACROMIAL DECOMPRESSION, DEBRIDEMENT,  SUBSCAPULARIS TENDON REPAIR;  Surgeon: Wyn Forster., MD;  Location: [facility];  Service: Orthopedics;  Laterality: Right;  . Doppler echocardiography  11/2005  . Cardiovascular stress test  07/2011  . Left heart catheterization with coronary angiogram N/A 02/14/2014   Procedure: LEFT HEART CATHETERIZATION WITH CORONARY ANGIOGRAM;  Surgeon: Lennette Bihari, MD;  Location: [facility] CATH LAB;  Service: Cardiovascular;  Laterality: N/A;   Social History   Social History  . Marital Status: Single    Spouse Name: N/A  . Number of Children: N/A  . Years of Education: N/A   Social History Main Topics  . Smoking status: Former Smoker    Types: Cigarettes    Quit date: 07/02/1992  . Smokeless tobacco: None     Comment: quit 10-15 yrs ago  . Alcohol Use: Yes     Comment: 6-12 pack nightly-6-7 beers,occ vodka,drinks every night  . Drug Use: No  . Sexual Activity: Yes   Other Topics Concern  . None   Social History Narrative   Family History  Problem Relation Age of Onset  . Cancer Mother     breast  . Hyperlipidemia Mother   . Hypertension Mother   . Hyperlipidemia Father   . Hypertension Father   . Heart disease Father   . Hyperlipidemia Sister   . Hypertension Sister   . Stroke Maternal Grandmother   . Heart attack Maternal Grandmother   . Heart attack Maternal Grandfather   . Hyperlipidemia Sister   . Hypertension Sister    No Known Allergies Prior to Admission medications   Medication Sig Start Date End Date Taking? Authorizing Provider  amLODipine (NORVASC) 5 MG tablet  Take 1 tablet (5 mg total) by mouth daily. 09/29/14  Yes Collene Gobble, MD  atorvastatin (LIPITOR) 20 MG tablet Take 1 tablet (20 mg total) by mouth daily. 09/29/14  Yes Collene Gobble, MD  diazepam (VALIUM) 5 MG tablet TAKE 1 TABLET BY MOUTH DAILY AS NEEDED FOR ANXIETY 08/05/14  Yes Collene Gobble, MD  famotidine (PEPCID) 20 MG tablet Take 20 mg by mouth 2 (two) times daily.   Yes Historical Provider, MD  ibuprofen (ADVIL,MOTRIN) 200 MG tablet Take 200 mg by mouth every 6 (six) hours as needed for mild pain.    Yes Historical Provider, MD  losartan-hydrochlorothiazide (HYZAAR) 100-12.5 MG per tablet TAKE 1 TABLET BY MOUTH EVERY DAY 11/07/14  Yes Collene Gobble, MD  ibuprofen  (ADVIL,MOTRIN) 600 MG tablet Take 1 every 8 hours as needed for back pain. Patient not taking: Reported on 09/29/2014 05/18/14   Collene Gobble, MD  nitroGLYCERIN (NITROSTAT) 0.4 MG SL tablet Place 1 tablet (0.4 mg total) under the tongue every 5 (five) minutes as needed for chest pain. Patient not taking: Reported on 09/29/2014 01/20/14   Brittainy M Sharol Harness, PA-C  omeprazole (PRILOSEC) 40 MG capsule Take 1 capsule (40 mg total) by mouth daily. Patient not taking: Reported on 03/06/2015 09/29/14   Collene Gobble, MD  valACYclovir (VALTREX) 1000 MG tablet One by mouth twice a day for 3 days Patient not taking: Reported on 03/06/2015 09/29/14   Collene Gobble, MD  zoster vaccine live, PF, (ZOSTAVAX) 16109 UNT/0.65ML injection Inject 19,400 Units into the skin once. Patient not taking: Reported on 09/29/2014 10/29/13   Collene Gobble, MD     ROS: The patient denies fevers, chills, night sweats, unintentional weight loss, chest pain, palpitations, wheezing, dyspnea on exertion, nausea, vomiting, abdominal pain, melena, numbness, weakness, or tingling.   All other systems have been reviewed and were otherwise negative with the exception of those mentioned in the HPI and as above.    PHYSICAL EXAM: Filed Vitals:   03/06/15 1123  BP: 116/70  Pulse: 79  Temp: 98.4 F (36.9 C)  Resp: 14   Body mass index is 28.49 kg/(m^2).   General: Alert, no acute distress HEENT:  Normocephalic, atraumatic, oropharynx patent. Eye: [facility] Cardiovascular:  Regular rate and rhythm, no rubs murmurs or gallops.  No Carotid bruits, radial pulse intact. No pedal edema.  Respiratory: Clear to auscultation bilaterally.  No wheezes, rales, or rhonchi.  No cyanosis, no use of accessory musculature Abdominal: No organomegaly, abdomen is soft and non-tender, positive bowel sounds.  No masses. Musculoskeletal: Gait intact. No edema, tenderness Skin: No rashes. Neurologic: Facial musculature symmetric. Psychiatric: Patient acts  appropriately throughout our interaction. Lymphatic: No cervical or submandibular lymphadenopathy Genitourinary: Prostate exam is normal. He has no urethral discharge. He is testicle exam normal.    LABS: Results for orders placed or performed in visit on 03/06/15  POCT CBC  Result Value Ref Range   WBC 6.6 4.6 - 10.2 K/uL   Lymph, poc 1.3 0.6 - 3.4   POC LYMPH PERCENT 19.9 10 - 50 %L   MID (cbc) 0.4 0 - 0.9   POC MID % 6.8 0 - 12 %M   POC Granulocyte 4.8 2 - 6.9   Granulocyte percent 73.3 37 - 80 %G   RBC 5.11 4.69 - 6.13 M/uL   Hemoglobin 15.4 14.1 - 18.1 g/dL   HCT, POC 60.4 54.0 - 53.7 %   MCV 89.3 80 - 97  fL   MCH, POC 30.1 27 - 31.2 pg   MCHC 33.7 31.8 - 35.4 g/dL   RDW, POC 16.1 %   Platelet Count, POC 195 142 - 424 K/uL   MPV 7.4 0 - 99.8 fL     EKG/XRAY:   Primary read interpreted by Dr. Cleta Alberts at [facility].   ASSESSMENT/PLAN: Patient placed on doxycycline and Valtrex. STD screening was done referral made to urology for their opinion. He can use Flonase for his allergies try to avoid any histamines because of his urinary complaints.  By signing my name below, I, Littie Deeds, attest that this documentation has been prepared under the direction and in the presence of Lesle Chris, MD.  Electronically Signed: Littie Deeds, Medical Scribe. 03/06/2015. 12:04 PM.   Gross sideeffects, risk and benefits, and alternatives of medications d/w patient. Patient is aware that all medications have potential sideeffects and we are unable to predict every sideeffect or drug-drug interaction that may occur.  Lesle Chris MD 03/06/2015 12:04 PM

## 2015-03-07 LAB — GC/CHLAMYDIA PROBE AMP
CT PROBE, AMP APTIMA: NEGATIVE
GC Probe RNA: NEGATIVE

## 2015-03-07 LAB — HEPATITIS C ANTIBODY: HCV Ab: NEGATIVE

## 2015-03-07 LAB — RPR

## 2015-03-07 LAB — PSA: PSA: 1.12 ng/mL (ref <=4.00)

## 2015-03-07 LAB — HIV ANTIBODY (ROUTINE TESTING W REFLEX): HIV 1&2 Ab, 4th Generation: NONREACTIVE

## 2015-03-10 ENCOUNTER — Telehealth: Payer: Self-pay

## 2015-03-10 DIAGNOSIS — J019 Acute sinusitis, unspecified: Secondary | ICD-10-CM

## 2015-03-10 NOTE — Telephone Encounter (Signed)
Dr. Cleta Alberts,  I spoke with Pt. And he wanted to know if there is anything else you can prescribe for his sinus infection. He said he is still not feeling better.  He did say he was taking the Doxycyline that was prescribed to him.

## 2015-03-11 MED ORDER — AMOXICILLIN-POT CLAVULANATE 875-125 MG PO TABS: 1.0000 | ORAL_TABLET | Freq: Two times a day (BID) | ORAL | Status: AC

## 2015-03-11 NOTE — Addendum Note (Signed)
Addended by: Marcellus ScottADKINS, Jhayla Podgorski L on: 03/11/2015 11:08 AM   Modules accepted: Orders, Medications

## 2015-03-11 NOTE — Telephone Encounter (Signed)
Call. Stop doxycycline. Change to Augmentin 875 BID number [id number]

## 2015-03-11 NOTE — Telephone Encounter (Signed)
Called and spoke with pt and he is aware of Dr. Ellis Parentsaub's recs to stop the doxy and change to augmentin 875 mg  BID x 10 days.  This new rx has been sent to the pts pharmacy and pt is aware. Med list has been updated.

## 2015-03-13 ENCOUNTER — Emergency Department (HOSPITAL_COMMUNITY)
Admission: EM | Admit: 2015-03-13 | Discharge: 2015-03-13 | Disposition: A | Discharge status: Home / Self Care | Payer: 59 | Attending: Emergency Medicine | Admitting: Emergency Medicine

## 2015-03-13 ENCOUNTER — Encounter (HOSPITAL_COMMUNITY): Payer: Self-pay | Admitting: Emergency Medicine

## 2015-03-13 ENCOUNTER — Emergency Department (HOSPITAL_COMMUNITY): Payer: 59

## 2015-03-13 DIAGNOSIS — Z8619 Personal history of other infectious and parasitic diseases: Secondary | ICD-10-CM | POA: Diagnosis not present

## 2015-03-13 DIAGNOSIS — F329 Major depressive disorder, single episode, unspecified: Secondary | ICD-10-CM | POA: Diagnosis not present

## 2015-03-13 DIAGNOSIS — Z792 Long term (current) use of antibiotics: Secondary | ICD-10-CM | POA: Diagnosis not present

## 2015-03-13 DIAGNOSIS — I10 Essential (primary) hypertension: Secondary | ICD-10-CM | POA: Diagnosis not present

## 2015-03-13 DIAGNOSIS — E785 Hyperlipidemia, unspecified: Secondary | ICD-10-CM | POA: Diagnosis not present

## 2015-03-13 DIAGNOSIS — Z9889 Other specified postprocedural states: Secondary | ICD-10-CM | POA: Diagnosis not present

## 2015-03-13 DIAGNOSIS — Z87891 Personal history of nicotine dependence: Secondary | ICD-10-CM | POA: Diagnosis not present

## 2015-03-13 DIAGNOSIS — Z79899 Other long term (current) drug therapy: Secondary | ICD-10-CM | POA: Diagnosis not present

## 2015-03-13 DIAGNOSIS — Z8719 Personal history of other diseases of the digestive system: Secondary | ICD-10-CM | POA: Insufficient documentation

## 2015-03-13 DIAGNOSIS — R208 Other disturbances of skin sensation: Secondary | ICD-10-CM | POA: Insufficient documentation

## 2015-03-13 DIAGNOSIS — Z87438 Personal history of other diseases of male genital organs: Secondary | ICD-10-CM | POA: Diagnosis not present

## 2015-03-13 DIAGNOSIS — R0789 Other chest pain: Secondary | ICD-10-CM

## 2015-03-13 DIAGNOSIS — F419 Anxiety disorder, unspecified: Secondary | ICD-10-CM | POA: Insufficient documentation

## 2015-03-13 DIAGNOSIS — R079 Chest pain, unspecified: Secondary | ICD-10-CM | POA: Diagnosis present

## 2015-03-13 LAB — BASIC METABOLIC PANEL
ANION GAP: 8 (ref 5–15)
BUN: 14 mg/dL (ref 6–20)
CALCIUM: 10 mg/dL (ref 8.9–10.3)
CO2: 29 mmol/L (ref 22–32)
CREATININE: 0.95 mg/dL (ref 0.61–1.24)
Chloride: 103 mmol/L (ref 101–111)
GFR calc Af Amer: >60 mL/min (ref >60)
GFR calc non Af Amer: >60 mL/min (ref >60)
GLUCOSE: 128 mg/dL — AB (ref 65–99)
Potassium: 4.3 mmol/L (ref 3.5–5.1)
Sodium: 140 mmol/L (ref 135–145)

## 2015-03-13 LAB — CBC
HCT: 43.3 % (ref 39.0–52.0)
HEMOGLOBIN: 15.6 g/dL (ref 13.0–17.0)
MCH: 31.5 pg (ref 26.0–34.0)
MCHC: 36 g/dL (ref 30.0–36.0)
MCV: 87.5 fL (ref 78.0–100.0)
PLATELETS: 191 10*3/uL (ref 150–400)
RBC: 4.95 MIL/uL (ref 4.22–5.81)
RDW: 12.2 % (ref 11.5–15.5)
WBC: 4.5 10*3/uL (ref 4.0–10.5)

## 2015-03-13 LAB — I-STAT TROPONIN, ED: Troponin i, poc: 0 ng/mL (ref 0.00–0.08)

## 2015-03-13 LAB — D-DIMER, QUANTITATIVE: D-Dimer, Quant: <0.27 ug/mL-FEU (ref 0.00–0.48)

## 2015-03-13 MED ORDER — SUCRALFATE 1 GM/10ML PO SUSP: 1.0000 g | Freq: Three times a day (TID) | ORAL | Status: DC

## 2015-03-13 MED ORDER — GI COCKTAIL ~~LOC~~
30.0000 mL | Freq: Once | ORAL | Status: AC
Start: 2015-03-13 — End: 2015-03-13
  Administered 2015-03-13: 30 mL via ORAL
  Filled 2015-03-13: qty 30

## 2015-03-13 NOTE — Discharge Instructions (Signed)
As discussed, your evaluation today has been largely reassuring.  But, it is important that you monitor your condition carefully, and do not hesitate to return to the ED if you develop new, or concerning changes in your condition.  Otherwise, please follow-up with our gastroenterologists for appropriate ongoing care.

## 2015-03-13 NOTE — ED Notes (Signed)
Pt comfortable with discharge and follow up instructions. Pt declines wheelchair, escorted to waiting area by this RN. Precriptions x1

## 2015-03-13 NOTE — ED Notes (Signed)
Called lab, they will run D-dimer off blue top already down there.

## 2015-03-13 NOTE — ED Notes (Signed)
Pt c/o center chest pain onset last night, reports pain as cool burning sensation that rises into throat. Pt reports numbness to right leg and left arm. Pt reports pain also to left jaw. Pt reports that he ate at [employer] and  Had spicy shrimp which afterwards is when the burning sensation began.

## 2015-03-13 NOTE — ED Provider Notes (Signed)
CSN: [id number]     Arrival date & time 03/13/15  0919 History   First MD Initiated Contact with Patient 03/13/15 [phone]     Chief Complaint  Patient presents with  . Chest Pain   HPI  Patient presents with concern of new sternal chest pain. Symptoms began yesterday, since onset has been persistent, with a burning, sharp discomfort throughout the sternum. Patient distinction is this pain from a prior episodes of GERD. Since onset symptoms of been persistent, with no relief in spite of taking OTC antiacid medication. No new dyspnea. However, there is new dysesthesia in the left upper and left lower extremities. No new weakness, or complete lack of sensation in either. No headache. No abdominal pain, nausea, vomiting. Patient does not smoke, drinks. Patient has had 3 unremarkable heart catheterizations, including one last year.   Past Medical History  Diagnosis Date  . Essential hypertension, benign   . Depression   . Reflux   . Hyperlipidemia   . Alcohol dependence (HCC)   . Hyperglycemia   . HSV-2 (herpes simplex virus 2) infection   . Prostatitis   . Anxiety   . History of heavy alcohol consumption    Past Surgical History  Procedure Laterality Date  . Shoulder surgery  1983    left  . Cardiac catheterization  2006  . Orif finger fracture  2009    rt ring  . Cholecystectomy  1990  . Shoulder arthroscopy with subacromial decompression, rotator cuff repair and bicep tendon repair Right 07/09/2013    Procedure: RIGHT SHOULDER ARTHROSCOPY WITH SUBACROMIAL DECOMPRESSION, DEBRIDEMENT,  SUBSCAPULARIS TENDON REPAIR;  Surgeon: Wyn Forster., MD;  Location: [facility];  Service: Orthopedics;  Laterality: Right;  . Doppler echocardiography  11/2005  . Cardiovascular stress test  07/2011  . Left heart catheterization with coronary angiogram N/A 02/14/2014    Procedure: LEFT HEART CATHETERIZATION WITH CORONARY ANGIOGRAM;  Surgeon: Lennette Bihari, MD;  [facility]  CATH LAB;  Service: Cardiovascular;  Laterality: N/A;   Family History  Problem Relation Age of Onset  . Cancer Mother     breast  . Hyperlipidemia Mother   . Hypertension Mother   . Hyperlipidemia Father   . Hypertension Father   . Heart disease Father   . Hyperlipidemia Sister   . Hypertension Sister   . Stroke Maternal Grandmother   . Heart attack Maternal Grandmother   . Heart attack Maternal Grandfather   . Hyperlipidemia Sister   . Hypertension Sister    Social History  Substance Use Topics  . Smoking status: Former Smoker    Types: Cigarettes    Quit date: 07/02/1992  . Smokeless tobacco: None     Comment: quit 10-15 yrs ago  . Alcohol Use: Yes     Comment: 6-12 pack nightly-6-7 beers,occ vodka,drinks every night    Review of Systems  Constitutional:       Per HPI, otherwise negative  HENT:       Per HPI, otherwise negative  Respiratory:       Per HPI, otherwise negative  Cardiovascular:       Per HPI, otherwise negative  Gastrointestinal: Negative for vomiting.  Endocrine:       Negative aside from HPI  Genitourinary:       Neg aside from HPI   Musculoskeletal:       Per HPI, otherwise negative  Skin: Negative.   Neurological: Negative for syncope.      Allergies  Review of patient's allergies indicates no known allergies.  Home Medications   Prior to Admission medications   Medication Sig Start Date End Date Taking? Authorizing Provider  amLODipine (NORVASC) 5 MG tablet Take 1 tablet (5 mg total) by mouth daily. 09/29/14   Collene Gobble, MD  amoxicillin-clavulanate (AUGMENTIN) 875-125 MG tablet Take 1 tablet by mouth 2 (two) times daily. 03/11/15 03/21/15  Collene Gobble, MD  atorvastatin (LIPITOR) 20 MG tablet Take 1 tablet (20 mg total) by mouth daily. 09/29/14   Collene Gobble, MD  diazepam (VALIUM) 5 MG tablet Take 1 tablet (5 mg total) by mouth daily as needed. for anxiety 03/06/15   Collene Gobble, MD  famotidine (PEPCID) 20 MG tablet Take 20 mg  by mouth 2 (two) times daily.    Historical Provider, MD  ibuprofen (ADVIL,MOTRIN) 200 MG tablet Take 200 mg by mouth every 6 (six) hours as needed for mild pain.     Historical Provider, MD  ibuprofen (ADVIL,MOTRIN) 600 MG tablet Take 1 every 8 hours as needed for back pain. 03/06/15   Collene Gobble, MD  losartan-hydrochlorothiazide (HYZAAR) 100-12.5 MG per tablet TAKE 1 TABLET BY MOUTH EVERY DAY 11/07/14   Collene Gobble, MD  nitroGLYCERIN (NITROSTAT) 0.4 MG SL tablet Place 1 tablet (0.4 mg total) under the tongue every 5 (five) minutes as needed for chest pain. Patient not taking: Reported on 09/29/2014 01/20/14   Brittainy M Sharol Harness, PA-C  omeprazole (PRILOSEC) 40 MG capsule Take 1 capsule (40 mg total) by mouth daily. Patient not taking: Reported on 03/06/2015 09/29/14   Collene Gobble, MD  valACYclovir (VALTREX) 1000 MG tablet One by mouth twice a day for 3 days 03/06/15   Collene Gobble, MD  zoster vaccine live, PF, (ZOSTAVAX) 81191 UNT/0.65ML injection Inject 19,400 Units into the skin once. Patient not taking: Reported on 09/29/2014 10/29/13   Collene Gobble, MD   BP 148/108 mmHg  Pulse 80  Temp(Src) 97.6 F (36.4 C) (Oral)  Resp 13  Ht  (1.753 m)  Wt 195 lb (88.451 kg)  BMI 28.78 kg/m2  SpO2 100% Physical Exam  Constitutional: He is oriented to person, place, and time. He appears well-developed. No distress.  HENT:  Head: Normocephalic and atraumatic.  Eyes: Conjunctivae and EOM are normal.  Cardiovascular: Normal rate and regular rhythm.   Pulmonary/Chest: Effort normal. No stridor. No respiratory distress.  Abdominal: He exhibits no distension.  Musculoskeletal: He exhibits no edema.  Neurological: He is alert and oriented to person, place, and time.  Skin: Skin is warm and dry.  Psychiatric: He has a normal mood and affect.  Nursing note and vitals reviewed.   ED Course  Procedures (including critical care time) Labs Review Labs Reviewed  BASIC METABOLIC PANEL - Abnormal;  Notable for the following:    Glucose, Bld 128 (*)    All other components within normal limits  CBC  D-DIMER, QUANTITATIVE (NOT AT [facility])  Rosezena Sensor, ED    Imaging Review Dg Chest 2 View  03/13/2015  CLINICAL DATA:  Chest tightness in pain since yesterday. EXAM: CHEST - 2 VIEW COMPARISON:  Two-view chest x-ray 02/05/2014. FINDINGS: The heart size and mediastinal contours are within normal limits. Both lungs are clear. The visualized skeletal structures are unremarkable. IMPRESSION: No active disease. Electronically Signed   By: Marin Roberts M.D.   On: 03/13/2015 09:51   I have personally reviewed and evaluated these images and lab results as part  of my medical decision-making.   EKG Interpretation   Date/Time:  Friday March 13 2015 09:20:26 EDT Ventricular Rate:  74 PR Interval:  140 QRS Duration: 88 QT Interval:  382 QTC Calculation: 424 R Axis:   53 Text Interpretation:  Normal sinus rhythm Normal ECG Sinus rhythm Normal  ECG Confirmed by Gerhard MunchLOCKWOOD, Elenore Wanninger  MD ([id number]) on 03/13/2015 9:50:22 AM     Pulse oxygen 100% room air normal Cardiac 70 sinus normal   12:26 PM On repeat exam the patient's symptoms have resolved. We had a lengthy conversation about the likely etiology of esophagitis given the reassuring findings, and the need to follow-up with gastroenterology for further evaluation, management. Patient has no ongoing complaints.   MDM  Reason presents with sternal chest discomfort.  Patient has appreciable pulses bilaterally, but given his description of left-sided dysesthesia, without focal neurologic changes, dissection was a consideration. Patient's dimer is normal, and was appreciable pulses, cap refill, sensation, there is low suspicion for occult dissection. Patient remained well, and with resolution of his sternal discomfort following GI cocktail, there is further reassurance. Patient started on additional GI medication, will follow up with  urology as an outpatient. No evidence from one coronary ischemia, and the patient has recent catheterization which was normal according to him.   Gerhard Munchobert Alailah Safley, MD 03/13/15 1227

## 2015-05-01 ENCOUNTER — Other Ambulatory Visit: Payer: Self-pay | Admitting: Emergency Medicine

## 2015-05-04 NOTE — Telephone Encounter (Signed)
Dr Cleta Albertsaub, pt is requesting a 90 day supply. You saw him in Oct, but don't see BP discussed. OK to RF 90 days?

## 2015-08-12 ENCOUNTER — Other Ambulatory Visit: Payer: Self-pay

## 2015-08-12 MED ORDER — LOSARTAN POTASSIUM-HCTZ 100-12.5 MG PO TABS: 1.0000 | ORAL_TABLET | Freq: Every day | ORAL | Status: DC

## 2015-09-15 ENCOUNTER — Ambulatory Visit (INDEPENDENT_AMBULATORY_CARE_PROVIDER_SITE_OTHER): Payer: 59 | Admitting: Emergency Medicine

## 2015-09-15 ENCOUNTER — Other Ambulatory Visit: Payer: Self-pay | Admitting: Emergency Medicine

## 2015-09-15 VITALS — BP 128/76 | HR 82 | Temp 98.0°F | Resp 16 | Ht 68.0 in | Wt 184.0 lb

## 2015-09-15 DIAGNOSIS — I10 Essential (primary) hypertension: Secondary | ICD-10-CM | POA: Diagnosis not present

## 2015-09-15 DIAGNOSIS — M62838 Other muscle spasm: Secondary | ICD-10-CM

## 2015-09-15 DIAGNOSIS — E782 Mixed hyperlipidemia: Secondary | ICD-10-CM

## 2015-09-15 DIAGNOSIS — Z125 Encounter for screening for malignant neoplasm of prostate: Secondary | ICD-10-CM | POA: Diagnosis not present

## 2015-09-15 LAB — POCT CBC
Granulocyte percent: 73.6 %G (ref 37–80)
HCT, POC: 44.2 % (ref 43.5–53.7)
Hemoglobin: 16.1 g/dL (ref 14.1–18.1)
Lymph, poc: 2.1 (ref 0.6–3.4)
MCH: 32.3 pg — AB (ref 27–31.2)
MCHC: 36.5 g/dL — AB (ref 31.8–35.4)
MCV: 88.6 fL (ref 80–97)
MID (CBC): 0.3 (ref 0–0.9)
MPV: 7.7 fL (ref 0–99.8)
PLATELET COUNT, POC: 237 10*3/uL (ref 142–424)
POC Granulocyte: 6.5 (ref 2–6.9)
POC LYMPH PERCENT: 23.3 %L (ref 10–50)
POC MID %: 3.1 %M (ref 0–12)
RBC: 4.99 M/uL (ref 4.69–6.13)
RDW, POC: 12.8 %
WBC: 8.8 10*3/uL (ref 4.6–10.2)

## 2015-09-15 MED ORDER — AMLODIPINE BESYLATE 5 MG PO TABS: 5.0000 mg | ORAL_TABLET | Freq: Every day | ORAL | Status: DC

## 2015-09-15 MED ORDER — DIAZEPAM 5 MG PO TABS: 5.0000 mg | ORAL_TABLET | Freq: Every day | ORAL | Status: DC | PRN

## 2015-09-15 MED ORDER — ATORVASTATIN CALCIUM 20 MG PO TABS: 20.0000 mg | ORAL_TABLET | Freq: Every day | ORAL | Status: DC

## 2015-09-15 MED ORDER — LOSARTAN POTASSIUM-HCTZ 100-12.5 MG PO TABS: 1.0000 | ORAL_TABLET | Freq: Every day | ORAL | Status: DC

## 2015-09-15 NOTE — Patient Instructions (Addendum)
IF you received an x-ray today, you will receive an invoice from [facility]. Please contact [facility] at [phone] with questions or concerns regarding your invoice.   IF you received labwork today, you will receive an invoice from [employer]. Please contact [facility] at [phone] with questions or concerns regarding your invoice.   Our billing staff will not be able to assist you with questions regarding bills from these companies.  You will be contacted with the lab results as soon as they are available. The fastest way to get your results is to activate your My Chart account. Instructions are located on the last page of this paperwork. If you have not heard from [place] regarding the results in 2 weeks, please contact this office.    Groin Strain A groin strain (also called a groin pull) is an injury to the muscles or tendon on the upper inner part of the thigh. These muscles are called the adductor muscles or groin muscles. They are responsible for moving the leg across the body. A muscle strain occurs when a muscle is overstretched and some muscle fibers are torn. A groin strain can range from mild to severe depending on how many muscle fibers are affected and whether the muscle fibers are partially or completely torn.  Groin strains usually occur during exercise or participation in sports. The injury often happens when a sudden, violent force is placed on a muscle, stretching the muscle too far. A strain is more likely to occur when your muscles are not warmed up or if you are not properly conditioned. Depending on the severity of the groin strain, recovery time may vary from a few weeks to several weeks. Severe injuries often require 4-6 weeks for recovery. In these cases, complete healing can take 4-5 months.  CAUSES   Stretching the groin muscles too far or too suddenly, often during side-to-side motion with an abrupt change in  direction.  Putting repeated stress on the groin muscles over a long period of time.  Performing vigorous activity without properly stretching the groin muscles beforehand. SYMPTOMS   Pain and tenderness in the groin area. This begins as sharp pain and persists as a dull ache.  Popping or snapping feeling when the injury occurs (for severe strains).  Swelling or bruising.  Muscle spasms.  Weakness in the leg.  Stiffness in the groin area with decreased ability to move the affected muscles. DIAGNOSIS  Your caregiver will perform a physical exam to diagnose a groin strain. You will be asked about your symptoms and how the injury occurred. X-rays are sometimes needed to rule out a broken bone or cartilage problems. Your caregiver may order a CT scan or MRI if a complete muscle tear is suspected. TREATMENT  A groin strain will often heal on its own. Your caregiver may prescribe medicines to help manage pain and swelling (anti-inflammatory medicine). You may be told to use crutches for the first few days to minimize your pain. HOME CARE INSTRUCTIONS   Rest. Do not use the strained muscle if it causes pain.  Put ice on the injured area.  Put ice in a plastic bag.  Place a towel between your skin and the bag.  Leave the ice on for 15-20 minutes, every 2-3 hours. Do this for the first 2 days after the injury.  Only take over-the-counter or prescription medicines as directed by your caregiver.  Wrap the injured area with an elastic bandage as directed by  your caregiver.  Keep the injured leg raised (elevated).  Walk, stretch, and perform range-of-motion exercises to improve blood flow to the injured area. Only perform these activities if you can do so without any pain. To prevent muscle strains:  Warm up before exercise.  Develop proper conditioning and strength in the groin muscles. SEEK IMMEDIATE MEDICAL CARE IF:   You have increased pain or swelling in the affected area.    Your symptoms are not improving or are getting worse. MAKE SURE YOU:   Understand these instructions.  Will watch your condition.  Will get help right away if you are not doing well or get worse.   This information is not intended to replace advice given to you by your health care provider. Make sure you discuss any questions you have with your health care provider.   Document Released: 01/12/2004 Document Revised: 05/02/2012 Document Reviewed: 01/18/2012 Elsevier Interactive Patient Education [employer]2016 [employer]

## 2015-09-15 NOTE — Progress Notes (Addendum)
Patient ID: Jeremiah [name], male   DOB: 04/12/1966, 50 y.o.   MRN: [id number]     By signing my name below, I, Jeremiah [name], attest that this documentation has been prepared under the direction and in the presence of Lesle ChrisSteven Ladarion Munyon, MD. Electronically Signed: Littie Deedsichard [name], Medical Scribe. 09/15/2015. 11:33 AM.   Chief Complaint:  Chief Complaint  Patient presents with  . growin pain    x 2-3 weeks    HPI: Jeremiah [name] is a 50 y.o. male who [facility] complaining of right groin pain that started 3 weeks ago. He is unsure what caused his groin pain. The pain is worse when jogging. Patient does not feel ill and denies any other new symptoms. He is UTD on colonoscopy. He currently works 60 hours a week. Patient has been working on weight loss and recently started a new diet (Isagenix). He has also cut down on alcohol use.  Past Medical History  Diagnosis Date  . Essential hypertension, benign   . Depression   . Reflux   . Hyperlipidemia   . Alcohol dependence (HCC)   . Hyperglycemia   . HSV-2 (herpes simplex virus 2) infection   . Prostatitis   . Anxiety   . History of heavy alcohol consumption    Past Surgical History  Procedure Laterality Date  . Shoulder surgery  1983    left  . Cardiac catheterization  2006  . Orif finger fracture  2009    rt ring  . Cholecystectomy  1990  . Shoulder arthroscopy with subacromial decompression, rotator cuff repair and bicep tendon repair Right 07/09/2013    Procedure: RIGHT SHOULDER ARTHROSCOPY WITH SUBACROMIAL DECOMPRESSION, DEBRIDEMENT,  SUBSCAPULARIS TENDON REPAIR;  Surgeon: Wyn Forsterobert V Sypher Jr., MD;  Location: [facility];  Service: Orthopedics;  Laterality: Right;  . Doppler echocardiography  11/2005  . Cardiovascular stress test  07/2011  . Left heart catheterization with coronary angiogram N/A 02/14/2014    Procedure: LEFT HEART CATHETERIZATION WITH CORONARY ANGIOGRAM;  Surgeon: Lennette Biharihomas A Kelly, MD;  [facility];  Service: Cardiovascular;  Laterality: N/A;   Social History   Social History  . Marital Status: Single    Spouse Name: N/A  . Number of Children: N/A  . Years of Education: N/A   Social History Main Topics  . Smoking status: Former Smoker    Types: Cigarettes    Quit date: 07/02/1992  . Smokeless tobacco: Never Used     Comment: quit 10-15 yrs ago  . Alcohol Use: 0.0 oz/week    0 Standard drinks or equivalent per week     Comment: 6-12 pack nightly-6-7 beers,occ vodka,drinks every night  . Drug Use: No  . Sexual Activity: Yes   Other Topics Concern  . None   Social History Narrative   Family History  Problem Relation Age of Onset  . Cancer Mother     breast  . Hyperlipidemia Mother   . Hypertension Mother   . Hyperlipidemia Father   . Hypertension Father   . Heart disease Father   . Hyperlipidemia Sister   . Hypertension Sister   . Stroke Maternal Grandmother   . Heart attack Maternal Grandmother   . Heart attack Maternal Grandfather   . Hyperlipidemia Sister   . Hypertension Sister    No Known Allergies Prior to Admission medications   Medication Sig Start Date End Date Taking? Authorizing Provider  amLODipine (NORVASC) 5 MG tablet Take 1 tablet (5  mg total) by mouth daily. 09/29/14  Yes Collene Gobble, MD  atorvastatin (LIPITOR) 20 MG tablet Take 1 tablet (20 mg total) by mouth daily. 09/29/14  Yes Collene Gobble, MD  diazepam (VALIUM) 5 MG tablet Take 1 tablet (5 mg total) by mouth daily as needed. for anxiety 03/06/15  Yes Collene Gobble, MD  doxycycline (VIBRAMYCIN) 100 MG capsule Take 100 mg by mouth 2 (two) times daily. 03/06/15  Yes Historical Provider, MD  famotidine (PEPCID) 20 MG tablet Take 20 mg by mouth daily as needed for heartburn or indigestion.    Yes Historical Provider, MD  ibuprofen (ADVIL,MOTRIN) 600 MG tablet Take 1 every 8 hours as needed for back pain. 03/06/15  Yes Collene Gobble, MD  losartan-hydrochlorothiazide (HYZAAR) 100-12.5 MG  tablet Take 1 tablet by mouth daily. 08/12/15  Yes Collene Gobble, MD  nitroGLYCERIN (NITROSTAT) 0.4 MG SL tablet Place 1 tablet (0.4 mg total) under the tongue every 5 (five) minutes as needed for chest pain. 01/20/14  Yes Brittainy Sherlynn Carbon, PA-C  omeprazole (PRILOSEC) 40 MG capsule Take 1 capsule (40 mg total) by mouth daily. Patient not taking: Reported on 09/15/2015 09/29/14   Collene Gobble, MD  sucralfate (CARAFATE) 1 GM/10ML suspension Take 10 mLs (1 g total) by mouth 4 (four) times daily -  with meals and at bedtime. 03/13/15 03/20/15  Gerhard Munch, MD  valACYclovir (VALTREX) 1000 MG tablet One by mouth twice a day for 3 days Patient not taking: Reported on 09/15/2015 03/06/15   Collene Gobble, MD  zoster vaccine live, PF, (ZOSTAVAX) 60454 UNT/0.65ML injection Inject 19,400 Units into the skin once. Patient not taking: Reported on 09/15/2015 10/29/13   Collene Gobble, MD     ROS: The patient denies fevers, chills, night sweats, unintentional weight loss, chest pain, palpitations, wheezing, dyspnea on exertion, nausea, vomiting, abdominal pain, dysuria, hematuria, melena, numbness, weakness, or tingling.  All other systems have been reviewed and were otherwise negative with the exception of those mentioned in the HPI and as above.    PHYSICAL EXAM: Filed Vitals:   09/15/15 1118  BP: 128/76  Pulse: 82  Temp: 98 F (36.7 C)  Resp: 16   Body mass index is 27.98 kg/(m^2).   General: Alert, no acute distress HEENT:  Normocephalic, atraumatic, oropharynx patent. Eye: Nonie Hoyer [facility] Cardiovascular:  Regular rate and rhythm, no rubs murmurs or gallops.  No Carotid bruits, radial pulse intact. No pedal edema.  Respiratory: Clear to auscultation bilaterally.  No wheezes, rales, or rhonchi.  No cyanosis, no use of accessory musculature Abdominal: No organomegaly, abdomen is soft and non-tender, positive bowel sounds.  No masses. Musculoskeletal: Gait intact. No edema, tenderness Skin: No  rashes. Neurologic: Facial musculature symmetric. Psychiatric: Patient acts appropriately throughout our interaction. Lymphatic: No cervical or submandibular lymphadenopathy. No lymph nodes palpable. Genitourinary: Examination of the right groin reveals mild tenderness to the external ring but no palpable hernia.    LABS: Results for orders placed or performed in visit on 09/15/15  POCT CBC  Result Value Ref Range   WBC 8.8 4.6 - 10.2 K/uL   Lymph, poc 2.1 0.6 - 3.4   POC LYMPH PERCENT 23.3 10 - 50 %L   MID (cbc) 0.3 0 - 0.9   POC MID % 3.1 0 - 12 %M   POC Granulocyte 6.5 2 - 6.9   Granulocyte percent 73.6 37 - 80 %G   RBC 4.99 4.69 - 6.13 M/uL   Hemoglobin  16.1 14.1 - 18.1 g/dL   HCT, POC 16.1 09.6 - 53.7 %   MCV 88.6 80 - 97 fL   MCH, POC 32.3 (A) 27 - 31.2 pg   MCHC 36.5 (A) 31.8 - 35.4 g/dL   RDW, POC 04.5 %   Platelet Count, POC 237 142 - 424 K/uL   MPV 7.7 0 - 99.8 fL     EKG/XRAY:   Primary read interpreted by Dr. Cleta Alberts at [facility].   ASSESSMENT/PLAN: Patient doing well. Blood pressure medications were refilled. Routine blood work was done. He was concerned about lymphoma in the groin. He has recently started an exercise program including running he had definite tenderness at the external ring on the right.. I advised him to back off of this for the present time and do non-impact bearing exercises. He will let me know if the pain is persistent in 2-3 weeks.I personally performed the services described in this documentation, which was scribed in my presence. The recorded information has been reviewed and is accurate.    Gross sideeffects, risk and benefits, and alternatives of medications d/w patient. Patient is aware that all medications have potential sideeffects and we are unable to predict every sideeffect or drug-drug interaction that may occur.  Lesle Chris MD 09/15/2015 11:33 AM

## 2015-09-16 LAB — LIPID PANEL
CHOLESTEROL: 157 mg/dL (ref 125–200)
HDL: 54 mg/dL (ref >=40)
LDL Cholesterol: 85 mg/dL (ref <130)
TRIGLYCERIDES: 88 mg/dL (ref <150)
Total CHOL/HDL Ratio: 2.9 Ratio (ref <=5.0)
VLDL: 18 mg/dL (ref <30)

## 2015-09-16 LAB — BASIC METABOLIC PANEL WITH GFR
BUN: 22 mg/dL (ref 7–25)
CO2: 24 mmol/L (ref 20–31)
CREATININE: 0.9 mg/dL (ref 0.70–1.33)
Calcium: 10.1 mg/dL (ref 8.6–10.3)
Chloride: 98 mmol/L (ref 98–110)
GFR, Est Non African American: >89 mL/min (ref >=60)
Glucose, Bld: 89 mg/dL (ref 65–99)
Potassium: 4.5 mmol/L (ref 3.5–5.3)
SODIUM: 137 mmol/L (ref 135–146)

## 2015-09-16 LAB — PSA: PSA: 1.17 ng/mL (ref <=4.00)

## 2015-09-16 LAB — HIV ANTIBODY (ROUTINE TESTING W REFLEX): HIV: NONREACTIVE

## 2016-01-21 ENCOUNTER — Other Ambulatory Visit: Payer: Self-pay | Admitting: Emergency Medicine

## 2016-01-25 ENCOUNTER — Ambulatory Visit (INDEPENDENT_AMBULATORY_CARE_PROVIDER_SITE_OTHER): Payer: 59 | Admitting: Emergency Medicine

## 2016-01-25 ENCOUNTER — Encounter: Payer: Self-pay | Admitting: Emergency Medicine

## 2016-01-25 ENCOUNTER — Ambulatory Visit (HOSPITAL_BASED_OUTPATIENT_CLINIC_OR_DEPARTMENT_OTHER)
Admission: RE | Admit: 2016-01-25 | Discharge: 2016-01-25 | Disposition: A | Discharge status: Home / Self Care | Payer: 59 | Source: Ambulatory Visit | Attending: Emergency Medicine | Admitting: Emergency Medicine

## 2016-01-25 ENCOUNTER — Encounter (HOSPITAL_BASED_OUTPATIENT_CLINIC_OR_DEPARTMENT_OTHER): Payer: Self-pay

## 2016-01-25 ENCOUNTER — Ambulatory Visit (INDEPENDENT_AMBULATORY_CARE_PROVIDER_SITE_OTHER): Payer: 59

## 2016-01-25 VITALS — BP 126/82 | HR 92 | Temp 98.2°F | Resp 17 | Ht 68.5 in | Wt 169.0 lb

## 2016-01-25 DIAGNOSIS — R634 Abnormal weight loss: Secondary | ICD-10-CM | POA: Diagnosis not present

## 2016-01-25 DIAGNOSIS — R1310 Dysphagia, unspecified: Secondary | ICD-10-CM

## 2016-01-25 DIAGNOSIS — R9389 Abnormal findings on diagnostic imaging of other specified body structures: Secondary | ICD-10-CM

## 2016-01-25 DIAGNOSIS — I10 Essential (primary) hypertension: Secondary | ICD-10-CM

## 2016-01-25 DIAGNOSIS — R938 Abnormal findings on diagnostic imaging of other specified body structures: Secondary | ICD-10-CM

## 2016-01-25 DIAGNOSIS — Z23 Encounter for immunization: Secondary | ICD-10-CM | POA: Diagnosis not present

## 2016-01-25 DIAGNOSIS — R700 Elevated erythrocyte sedimentation rate: Secondary | ICD-10-CM

## 2016-01-25 DIAGNOSIS — E782 Mixed hyperlipidemia: Secondary | ICD-10-CM | POA: Diagnosis not present

## 2016-01-25 DIAGNOSIS — Z1159 Encounter for screening for other viral diseases: Secondary | ICD-10-CM | POA: Diagnosis not present

## 2016-01-25 LAB — POCT CBC
GRANULOCYTE PERCENT: 70.3 % (ref 37–80)
HEMATOCRIT: 43.1 % — AB (ref 43.5–53.7)
HEMOGLOBIN: 15.3 g/dL (ref 14.1–18.1)
Lymph, poc: 1.4 (ref 0.6–3.4)
MCH, POC: 32.6 pg — AB (ref 27–31.2)
MCHC: 35.5 g/dL — AB (ref 31.8–35.4)
MCV: 91.8 fL (ref 80–97)
MID (cbc): 0.5 (ref 0–0.9)
MPV: 7.7 fL (ref 0–99.8)
POC GRANULOCYTE: 4.4 (ref 2–6.9)
POC LYMPH %: 22 % (ref 10–50)
POC MID %: 7.7 %M (ref 0–12)
Platelet Count, POC: 200 10*3/uL (ref 142–424)
RBC: 4.69 M/uL (ref 4.69–6.13)
RDW, POC: 13.2 %
WBC: 6.3 10*3/uL (ref 4.6–10.2)

## 2016-01-25 LAB — COMPLETE METABOLIC PANEL WITH GFR
ALT: 21 U/L (ref 9–46)
AST: 22 U/L (ref 10–35)
Albumin: 4.8 g/dL (ref 3.6–5.1)
Alkaline Phosphatase: 68 U/L (ref 40–115)
BILIRUBIN TOTAL: 0.8 mg/dL (ref 0.2–1.2)
BUN: 14 mg/dL (ref 7–25)
CALCIUM: 9.9 mg/dL (ref 8.6–10.3)
CO2: 28 mmol/L (ref 20–31)
Chloride: 105 mmol/L (ref 98–110)
Creat: 0.92 mg/dL (ref 0.70–1.33)
Glucose, Bld: 85 mg/dL (ref 65–99)
Potassium: 4.2 mmol/L (ref 3.5–5.3)
Sodium: 142 mmol/L (ref 135–146)
TOTAL PROTEIN: 7.2 g/dL (ref 6.1–8.1)

## 2016-01-25 LAB — TSH: TSH: 1.48 m[IU]/L (ref 0.40–4.50)

## 2016-01-25 LAB — POCT SEDIMENTATION RATE: POCT SED RATE: 50 mm/h — AB (ref 0–22)

## 2016-01-25 MED ORDER — OMEPRAZOLE 40 MG PO CPDR: 40.0000 mg | DELAYED_RELEASE_CAPSULE | Freq: Every day | ORAL | 3 refills | Status: DC

## 2016-01-25 MED ORDER — IOPAMIDOL (ISOVUE-300) INJECTION 61%
100.0000 mL | Freq: Once | INTRAVENOUS | Status: AC | PRN
  Administered 2016-01-25: 75 mL via INTRAVENOUS

## 2016-01-25 NOTE — Progress Notes (Addendum)
Patient ID: Jeremiah Heighthomas B Diluzio, male   DOB: 08/25/1965, 50 y.o.   MRN: [id number]    By signing my name below I, Shelah LewandowskyJoseph Archie, attest that this documentation has been prepared under the direction and in the presence of Lesle ChrisSteven Taylen Osorto, MD. Electonically Signed. Shelah LewandowskyJoseph Danzig, Scribe 01/25/2016 at 12:28 PM   Chief Complaint:  Chief Complaint  Patient presents with  . Dysphagia    HPI: Jeremiah [name] is a 50 y.o. male who [facility] complaining of dysphagia that started a year ago. Pt reports that every now and the when he swallows food feels like it "just gets stuck" and his throat "just locks up and [he] cant breath for a second". Dysphagia has been worsening because dysphagia has started occurring with mash potatoes and soft foods as well as meat and stringy food. Pt can swallow food including chunks of steak or chicken without difficulty most of the time. Pt reports chronic reflux.   Pt reports increased stress from increased work load.   Pt reports that he is starting to have some mild sinus congestion within the past day.  Pt reports that he has been intentionally losing weight.   Pt reports having normal BMs. Pt had a colonoscopy 2 years ago.   Pt has history of smoking and quit 20 years ago. Pt denies any recent illegal drug use.   [place] for a [employer] concert and will [place] to [place] on 02/03/16.  Past Medical History:  Diagnosis Date  . Alcohol dependence (HCC)   . Anxiety   . Depression   . Essential hypertension, benign   . History of heavy alcohol consumption   . HSV-2 (herpes simplex virus 2) infection   . Hyperglycemia   . Hyperlipidemia   . Prostatitis   . Reflux    Past Surgical History:  Procedure Laterality Date  . CARDIAC CATHETERIZATION  2006  . CARDIOVASCULAR STRESS TEST  07/2011  . CHOLECYSTECTOMY  1990  . DOPPLER ECHOCARDIOGRAPHY  11/2005  . LEFT HEART CATHETERIZATION WITH CORONARY ANGIOGRAM  N/A 02/14/2014   Procedure: LEFT HEART CATHETERIZATION WITH CORONARY ANGIOGRAM;  Surgeon: Lennette Biharihomas A Kelly, MD;  [facility];  Service: Cardiovascular;  Laterality: N/A;  . ORIF FINGER FRACTURE  2009   rt ring  . SHOULDER ARTHROSCOPY WITH SUBACROMIAL DECOMPRESSION, ROTATOR CUFF REPAIR AND BICEP TENDON REPAIR Right 07/09/2013   Procedure: RIGHT SHOULDER ARTHROSCOPY WITH SUBACROMIAL DECOMPRESSION, DEBRIDEMENT,  SUBSCAPULARIS TENDON REPAIR;  Surgeon: Wyn Forsterobert V Sypher Jr., MD;  Location: [facility];  Service: Orthopedics;  Laterality: Right;  . SHOULDER SURGERY  1983   left   Social History   Social History  . Marital status: Single    Spouse name: N/A  . Number of children: N/A  . Years of education: N/A   Social History Main Topics  . Smoking status: Former Smoker    Types: Cigarettes    Quit date: 07/02/1992  . Smokeless tobacco: Never Used     Comment: quit 10-15 yrs ago  . Alcohol use 0.0 oz/week     Comment: 6-12 pack nightly-6-7 beers,occ vodka,drinks every night  . Drug use: No  . Sexual activity: Yes   Other Topics Concern  . None   Social History Narrative  . None   Family History  Problem Relation Age of Onset  . Cancer Mother     breast  . Hyperlipidemia Mother   . Hypertension  Mother   . Hyperlipidemia Father   . Hypertension Father   . Heart disease Father   . Hyperlipidemia Sister   . Hypertension Sister   . Stroke Maternal Grandmother   . Heart attack Maternal Grandmother   . Heart attack Maternal Grandfather   . Hyperlipidemia Sister   . Hypertension Sister    No Known Allergies Prior to Admission medications   Medication Sig Start Date End Date Taking? Authorizing Provider  amLODipine (NORVASC) 5 MG tablet Take 1 tablet (5 mg total) by mouth daily. 09/15/15  Yes Collene Gobble, MD  atorvastatin (LIPITOR) 20 MG tablet Take 1 tablet (20 mg total) by mouth daily. 09/15/15  Yes Collene Gobble, MD  diazepam (VALIUM) 5 MG tablet Take 1  tablet (5 mg total) by mouth daily as needed. for anxiety 09/15/15  Yes Collene Gobble, MD  famotidine (PEPCID) 20 MG tablet Take 20 mg by mouth daily as needed for heartburn or indigestion.    Yes Historical Provider, MD  ibuprofen (ADVIL,MOTRIN) 600 MG tablet Take 1 every 8 hours as needed for back pain. 03/06/15  Yes Collene Gobble, MD  losartan-hydrochlorothiazide (HYZAAR) 100-12.5 MG tablet Take 1 tablet by mouth daily. 09/15/15  Yes Collene Gobble, MD  nitroGLYCERIN (NITROSTAT) 0.4 MG SL tablet Place 1 tablet (0.4 mg total) under the tongue every 5 (five) minutes as needed for chest pain. 01/20/14  Yes Brittainy Sherlynn Carbon, PA-C  omeprazole (PRILOSEC) 40 MG capsule Take 1 capsule (40 mg total) by mouth daily. 09/29/14  Yes Collene Gobble, MD  valACYclovir (VALTREX) 1000 MG tablet One by mouth twice a day for 3 days 03/06/15  Yes Collene Gobble, MD  zoster vaccine live, PF, (ZOSTAVAX) 16109 UNT/0.65ML injection Inject 19,400 Units into the skin once. 10/29/13  Yes Collene Gobble, MD  sucralfate (CARAFATE) 1 GM/10ML suspension Take 10 mLs (1 g total) by mouth 4 (four) times daily -  with meals and at bedtime. 03/13/15 03/20/15  Gerhard Munch, MD     ROS: The patient denies fevers, chills, night sweats, unintentional weight loss, chest pain, palpitations, wheezing, dyspnea on exertion, nausea, vomiting, abdominal pain, dysuria, hematuria, melena, numbness, weakness, or tingling. Pt is positive for dysphagia, mild sinus congestion, and increased stress.  All other systems have been reviewed and were otherwise negative with the exception of those mentioned in the HPI and as above.    PHYSICAL EXAM: Vitals:   01/25/16 1133  BP: 126/82  Pulse: 92  Resp: 17  Temp: 98.2 F (36.8 C)   Body mass index is 25.32 kg/m.   General: Alert, no acute distress HEENT:  Normocephalic, atraumatic, oropharynx patent. Eye: Nonie Hoyer [facility] Cardiovascular:  Regular rate and rhythm, no rubs murmurs or gallops.  No Carotid  bruits, radial pulse intact. No pedal edema.  Respiratory: Clear to auscultation bilaterally.  No wheezes, rales, or rhonchi.  No cyanosis, no use of accessory musculature Abdominal: No organomegaly, abdomen is soft and non-tender, positive bowel sounds.  No masses. Musculoskeletal: Gait intact. No edema, tenderness Skin: No rashes. Neurologic: Facial musculature symmetric. Psychiatric: Patient acts appropriately throughout our interaction. Lymphatic: No cervical or submandibular lymphadenopathy   Wt Readings from Last 3 Encounters:  01/25/16 169 lb (76.7 kg)  09/15/15 184 lb (83.5 kg)  03/13/15 195 lb (88.5 kg)     LABS: Results for orders placed or performed in visit on 01/25/16  POCT CBC  Result Value Ref Range   WBC 6.3 4.6 - 10.2  K/uL   Lymph, poc 1.4 0.6 - 3.4   POC LYMPH PERCENT 22.0 10 - 50 %L   MID (cbc) 0.5 0 - 0.9   POC MID % 7.7 0 - 12 %M   POC Granulocyte 4.4 2 - 6.9   Granulocyte percent 70.3 37 - 80 %G   RBC 4.69 4.69 - 6.13 M/uL   Hemoglobin 15.3 14.1 - 18.1 g/dL   HCT, POC 69.6 (A) 29.5 - 53.7 %   MCV 91.8 80 - 97 fL   MCH, POC 32.6 (A) 27 - 31.2 pg   MCHC 35.5 (A) 31.8 - 35.4 g/dL   RDW, POC 28.4 %   Platelet Count, POC 200 142 - 424 K/uL   MPV 7.7 0 - 99.8 fL      EKG/XRAY:   Primary read interpreted by Dr. Cleta Alberts at [facility].  Dg Neck Soft Tissue  Result Date: 01/25/2016 CLINICAL DATA:  Dysphagia starting year ago. EXAM: NECK SOFT TISSUES - 1+ VIEW COMPARISON:  None. FINDINGS: Prevertebral soft tissues of normal thickness. Epiglottis appears normal. No radiographic abnormality of the glottic structures. Spurring and loss of intervertebral disc height at C5-6. IMPRESSION: 1. No significant radiographic abnormality of the soft tissues of the neck. In the workup of chronic dysphagia, follow up barium esophagram may prove helpful. 2. Mild spondylosis and degenerative disc disease at C5-6. Electronically Signed   By: Gaylyn Rong M.D.   On: 01/25/2016  12:31   Dg Chest 2 View  Result Date: 01/25/2016 CLINICAL DATA:  Dysphagia starting 1 year ago. Sensation of food sticking in the throat. EXAM: CHEST  2 VIEW COMPARISON:  None. FINDINGS: The heart size and mediastinal contours are within normal limits. Both lungs are clear. Mild thoracic spondylosis. IMPRESSION: 1. Mild thoracic spondylosis. Otherwise, no significant abnormalities are observed. Electronically Signed   By: Gaylyn Rong M.D.   On: 01/25/2016 12:23       ASSESSMENT/PLAN: Will check CT of the neck. Referral made to Dr. Elnoria Howard for his evaluation by endoscopy. Routine labs were done today. His Prilosec was refilled.I personally performed the services described in this documentation, which was scribed in my presence. The recorded information has been reviewed and is accurate. Sedimentation rate returned elevated at 50. He was sent to the ENT to evaluate the abnormal CT of the neck. We need to proceed with endoscopy as soon as possible upon his return from his trip to [facility]. I am very concerned about the weight loss. His elevated sedimentation rate would say there is definitely something organic as a source of his symptoms and weight loss and dysphagia.   Gross sideeffects, risk and benefits, and alternatives of medications d/w patient. Patient is aware that all medications have potential sideeffects and we are unable to predict every sideeffect or drug-drug interaction that may occur.  Lesle Chris MD 01/25/2016 11:39 AM

## 2016-01-25 NOTE — Patient Instructions (Addendum)
Please report to [facility] on [address] off [address] for your CT scan of your neck.  Please go now and do not eat or drink anything on your way.   IF you received an x-ray today, you will receive an invoice from [facility]. Please contact [facility] at [phone] with questions or concerns regarding your invoice.   IF you received labwork today, you will receive an invoice from [facility]. Please contact [facility] at [phone] with questions or concerns regarding your invoice.   Our billing staff will not be able to assist you with questions regarding bills from these companies.  You will be contacted with the lab results as soon as they are available. The fastest way to get your results is to activate your My Chart account. Instructions are located on the last page of this paperwork. If you have not heard from us regarding the results in 2 weeks, please contact this office.     Influenza (Flu) Vaccine (Inactivated or Recombinant):  1. Why get vaccinated? Influenza ("flu") is a contagious disease that spreads around the [place] every year, usually between October and May. Flu is caused by influenza viruses, and is spread mainly by coughing, sneezing, and close contact. Anyone can get flu. Flu strikes suddenly and can last several days. Symptoms vary by age, but can include:  fever/chills  sore throat  muscle aches  fatigue  cough  headache  runny or stuffy nose Flu can also lead to pneumonia and blood infections, and cause diarrhea and seizures in children. If you have a medical condition, such as heart or lung disease, flu can make it worse. Flu is more dangerous for some people. Infants and young children, people 52 years of age and older, pregnant women, and people with certain health conditions or a weakened immune system are at greatest risk. Each year thousands of people in the [place] die from  flu, and many more are hospitalized. Flu vaccine can:  keep you from getting flu,  make flu less severe if you do get it, and  keep you from spreading flu to your family and other people. 2. Inactivated and recombinant flu vaccines A dose of flu vaccine is recommended every flu season. Children 6 months through 718 years of age may need two doses during the same flu season. Everyone else needs only one dose each flu season. Some inactivated flu vaccines contain a very small amount of a mercury-based preservative called thimerosal. Studies have not shown thimerosal in vaccines to be harmful, but flu vaccines that do not contain thimerosal are available. There is no live flu virus in flu shots. They cannot cause the flu. There are many flu viruses, and they are always changing. Each year a new flu vaccine is made to protect against three or four viruses that are likely to cause disease in the upcoming flu season. But even when the vaccine doesn't exactly match these viruses, it may still provide some protection. Flu vaccine cannot prevent:  flu that is caused by a virus not covered by the vaccine, or  illnesses that look like flu but are not. It takes about 2 weeks for protection to develop after vaccination, and protection lasts through the flu season. 3. Some people should not get this vaccine Tell the person who is giving you the vaccine:  If you have any severe, life-threatening allergies. If you ever had a life-threatening allergic reaction after a dose of flu vaccine,  or have a severe allergy to any part of this vaccine, you may be advised not to get vaccinated. Most, but not all, types of flu vaccine contain a small amount of egg protein.  If you ever had Guillain-Barre Syndrome (also called GBS). Some people with a history of GBS should not get this vaccine. This should be discussed with your doctor.  If you are not feeling well. It is usually okay to get flu vaccine when you have a  mild illness, but you might be asked to come back when you feel better. 4. Risks of a vaccine reaction With any medicine, including vaccines, there is a chance of reactions. These are usually mild and go away on their own, but serious reactions are also possible. Most people who get a flu shot do not have any problems with it. Minor problems following a flu shot include:  soreness, redness, or swelling where the shot was given  hoarseness  sore, red or itchy eyes  cough  fever  aches  headache  itching  fatigue If these problems occur, they usually begin soon after the shot and last 1 or 2 days. More serious problems following a flu shot can include the following:  There may be a small increased risk of Guillain-Barre Syndrome (GBS) after inactivated flu vaccine. This risk has been estimated at 1 or 2 additional cases per million people vaccinated. This is much lower than the risk of severe complications from flu, which can be prevented by flu vaccine.  Young children who get the flu shot along with pneumococcal vaccine (PCV13) and/or DTaP vaccine at the same time might be slightly more likely to have a seizure caused by fever. Ask your doctor for more information. Tell your doctor if a child who is getting flu vaccine has ever had a seizure. Problems that could happen after any injected vaccine:  People sometimes faint after a medical procedure, including vaccination. Sitting or lying down for about 15 minutes can help prevent fainting, and injuries caused by a fall. Tell your doctor if you feel dizzy, or have vision changes or ringing in the ears.  Some people get severe pain in the shoulder and have difficulty moving the arm where a shot was given. This happens very rarely.  Any medication can cause a severe allergic reaction. Such reactions from a vaccine are very rare, estimated at about 1 in a million doses, and would happen within a few minutes to a few hours after the  vaccination. As with any medicine, there is a very remote chance of a vaccine causing a serious injury or death. The safety of vaccines is always being monitored. For more information, visit: http://floyd.org/ 5. What if there is a serious reaction? What should I look for?  Look for anything that concerns you, such as signs of a severe allergic reaction, very high fever, or unusual behavior. Signs of a severe allergic reaction can include hives, swelling of the face and throat, difficulty breathing, a fast heartbeat, dizziness, and weakness. These would start a few minutes to a few hours after the vaccination. What should I do?  If you think it is a severe allergic reaction or other emergency that can't wait, call 9-1-1 and get the person to the nearest hospital. Otherwise, call your doctor.  Reactions should be reported to the Vaccine Adverse Event Reporting System (VAERS). Your doctor should file this report, or you can do it yourself through the VAERS web site at www.vaers.LAgents.no, or by calling  210-522-1573. VAERS does not give medical advice. 6. The National Vaccine Injury Compensation Program The [employer] Vaccine Injury Compensation Program (VICP) is a federal program that was created to compensate people who may have been injured by certain vaccines. Persons who believe they may have been injured by a vaccine can learn about the program and about filing a claim by calling 1-(856)159-1204 or visiting the VICP website at SpiritualWord.at. There is a time limit to file a claim for compensation. 7. How can I learn more?  Ask your healthcare provider. He or she can give you the vaccine package insert or suggest other sources of information.  Call your local or state health department.  Contact the Centers for Disease Control and Prevention (CDC):  Call 754-352-2621 (1-800-CDC-INFO) or  Visit CDC's website at BiotechRoom.com.cy Vaccine Information Statement  Inactivated Influenza Vaccine (01/03/2014)   This information is not intended to replace advice given to you by your health care provider. Make sure you discuss any questions you have with your health care provider.   Document Released: 03/10/2006 Document Revised: 06/06/2014 Document Reviewed: 01/06/2014 Elsevier Interactive Patient Education [employer]

## 2016-01-26 ENCOUNTER — Other Ambulatory Visit: Payer: Self-pay | Admitting: Emergency Medicine

## 2016-01-26 ENCOUNTER — Telehealth: Payer: Self-pay | Admitting: Emergency Medicine

## 2016-01-26 DIAGNOSIS — R700 Elevated erythrocyte sedimentation rate: Secondary | ICD-10-CM | POA: Insufficient documentation

## 2016-01-26 DIAGNOSIS — J0100 Acute maxillary sinusitis, unspecified: Secondary | ICD-10-CM

## 2016-01-26 DIAGNOSIS — R634 Abnormal weight loss: Secondary | ICD-10-CM | POA: Insufficient documentation

## 2016-01-26 DIAGNOSIS — R1310 Dysphagia, unspecified: Secondary | ICD-10-CM | POA: Insufficient documentation

## 2016-01-26 LAB — HEPATITIS C ANTIBODY: HCV AB: NEGATIVE

## 2016-01-26 MED ORDER — AZITHROMYCIN 250 MG PO TABS: ORAL_TABLET | ORAL | 0 refills | Status: DC

## 2016-01-26 NOTE — Progress Notes (Signed)
Thank you for all your help!

## 2016-01-26 NOTE — Progress Notes (Signed)
z-pak 

## 2016-01-26 NOTE — Telephone Encounter (Signed)
Patient requesting a Z-Pak to [place] with [place] [facility] [place]. He has a history of recurrent sinus infections. This was sent to the pharmacy.

## 2016-02-24 ENCOUNTER — Telehealth: Payer: Self-pay | Admitting: *Deleted

## 2016-10-26 ENCOUNTER — Other Ambulatory Visit: Payer: Self-pay | Admitting: Emergency Medicine

## 2016-10-26 DIAGNOSIS — I10 Essential (primary) hypertension: Secondary | ICD-10-CM

## 2016-10-26 DIAGNOSIS — E782 Mixed hyperlipidemia: Secondary | ICD-10-CM

## 2016-10-26 NOTE — Telephone Encounter (Signed)
Please call patient. Rx authorized. Needs to schedule follow-up and establish with new PCP since Dr. Cleta Albertsaub has retired.  Meds ordered this encounter  Medications  . amLODipine (NORVASC) 5 MG tablet    Sig: TAKE ONE TABLET BY MOUTH ONCE DAILY    Dispense:  90 tablet    Refill:  0  . losartan-hydrochlorothiazide (HYZAAR) 100-12.5 MG tablet    Sig: TAKE ONE TABLET BY MOUTH ONCE DAILY    Dispense:  90 tablet    Refill:  0  . atorvastatin (LIPITOR) 20 MG tablet    Sig: TAKE ONE TABLET BY MOUTH ONCE DAILY    Dispense:  90 tablet    Refill:  0

## 2016-10-31 ENCOUNTER — Other Ambulatory Visit: Payer: Self-pay | Admitting: Emergency Medicine

## 2016-10-31 DIAGNOSIS — M62838 Other muscle spasm: Secondary | ICD-10-CM

## 2016-11-01 NOTE — Telephone Encounter (Signed)
12/2015 last ov daub  08/2015 last refill

## 2016-11-10 ENCOUNTER — Telehealth: Payer: Self-pay | Admitting: General Practice

## 2016-11-10 NOTE — Telephone Encounter (Signed)
Pharmacy is calling to request a refill on diazepan 5mg 

## 2016-11-11 ENCOUNTER — Other Ambulatory Visit: Payer: Self-pay

## 2016-11-11 DIAGNOSIS — M62838 Other muscle spasm: Secondary | ICD-10-CM

## 2016-11-11 MED ORDER — DIAZEPAM 5 MG PO TABS: 5.0000 mg | ORAL_TABLET | Freq: Every day | ORAL | 0 refills | Status: DC | PRN

## 2016-11-11 NOTE — Telephone Encounter (Signed)
Per Benny LennertSarah Weber ok to fill for 5 tabs and have pt call and get establish with a new provider, message sent to pharmacy and Rx awaiting signature from Sarah.

## 2016-11-11 NOTE — Progress Notes (Unsigned)
Please call the patient.  I have given him a small supply of his medication but he needs to have an OV with his new PCP of his choice.

## 2016-12-23 ENCOUNTER — Ambulatory Visit (INDEPENDENT_AMBULATORY_CARE_PROVIDER_SITE_OTHER): Payer: No Typology Code available for payment source | Admitting: Urgent Care

## 2016-12-23 ENCOUNTER — Encounter: Payer: Self-pay | Admitting: Urgent Care

## 2016-12-23 VITALS — BP 119/85 | HR 83 | Temp 98.1°F | Resp 16 | Ht 68.5 in | Wt 178.8 lb

## 2016-12-23 DIAGNOSIS — I10 Essential (primary) hypertension: Secondary | ICD-10-CM

## 2016-12-23 DIAGNOSIS — K219 Gastro-esophageal reflux disease without esophagitis: Secondary | ICD-10-CM | POA: Diagnosis not present

## 2016-12-23 DIAGNOSIS — M5010 Cervical disc disorder with radiculopathy, unspecified cervical region: Secondary | ICD-10-CM

## 2016-12-23 DIAGNOSIS — M5030 Other cervical disc degeneration, unspecified cervical region: Secondary | ICD-10-CM

## 2016-12-23 DIAGNOSIS — F419 Anxiety disorder, unspecified: Secondary | ICD-10-CM

## 2016-12-23 DIAGNOSIS — E782 Mixed hyperlipidemia: Secondary | ICD-10-CM | POA: Diagnosis not present

## 2016-12-23 MED ORDER — LOSARTAN POTASSIUM-HCTZ 100-12.5 MG PO TABS: 1.0000 | ORAL_TABLET | Freq: Every day | ORAL | 1 refills | Status: DC

## 2016-12-23 MED ORDER — IBUPROFEN 600 MG PO TABS: ORAL_TABLET | ORAL | 1 refills | Status: AC

## 2016-12-23 MED ORDER — FAMOTIDINE 20 MG PO TABS: 20.0000 mg | ORAL_TABLET | Freq: Every day | ORAL | 5 refills | Status: DC | PRN

## 2016-12-23 MED ORDER — AMLODIPINE BESYLATE 5 MG PO TABS: 5.0000 mg | ORAL_TABLET | Freq: Every day | ORAL | 1 refills | Status: DC

## 2016-12-23 MED ORDER — OMEPRAZOLE 40 MG PO CPDR: 40.0000 mg | DELAYED_RELEASE_CAPSULE | Freq: Every day | ORAL | 1 refills | Status: DC

## 2016-12-23 MED ORDER — PREDNISONE 20 MG PO TABS: 40.0000 mg | ORAL_TABLET | Freq: Every day | ORAL | 0 refills | Status: DC

## 2016-12-23 MED ORDER — ALPRAZOLAM 0.5 MG PO TABS: 0.5000 mg | ORAL_TABLET | Freq: Every evening | ORAL | 0 refills | Status: DC | PRN

## 2016-12-23 NOTE — Patient Instructions (Addendum)
Alprazolam tablets What is this medicine? ALPRAZOLAM (al PRAY zoe lam) is a benzodiazepine. It is used to treat anxiety and panic attacks. This medicine may be used for other purposes; ask your health care provider or pharmacist if you have questions. COMMON BRAND NAME(S): Xanax What should I tell my health care provider before I take this medicine? They need to know if you have any of these conditions: -an alcohol or drug abuse problem -bipolar disorder, depression, psychosis or other mental health conditions -glaucoma -kidney or liver disease -lung or breathing disease -myasthenia gravis -Parkinson's disease -porphyria -seizures or a history of seizures -suicidal thoughts -an unusual or allergic reaction to alprazolam, other benzodiazepines, foods, dyes, or preservatives -pregnant or trying to get pregnant -breast-feeding How should I use this medicine? Take this medicine by mouth with a glass of water. Follow the directions on the prescription label. Take your medicine at regular intervals. Do not take it more often than directed. Do not stop taking except on your doctor's advice. A special MedGuide will be given to you by the pharmacist with each prescription and refill. Be sure to read this information carefully each time. Talk to your pediatrician regarding the use of this medicine in children. Special care may be needed. Overdosage: If you think you have taken too much of this medicine contact a poison control center or emergency room at once. NOTE: This medicine is only for you. Do not share this medicine with others. What if I miss a dose? If you miss a dose, take it as soon as you can. If it is almost time for your next dose, take only that dose. Do not take double or extra doses. What may interact with this medicine? Do not take this medicine with any of the following medications: -certain antiviral medicines for HIV or AIDS like delavirdine, indinavir -certain medicines for  fungal infections like ketoconazole and itraconazole -narcotic medicines for cough -sodium oxybate This medicine may also interact with the following medications: -alcohol -antihistamines for allergy, cough and cold -certain antibiotics like clarithromycin, erythromycin, isoniazid, rifampin, rifapentine, rifabutin, and troleandomycin -certain medicines for blood pressure, heart disease, irregular heart beat -certain medicines for depression, like amitriptyline, fluoxetine, sertraline -certain medicines for seizures like carbamazepine, oxcarbazepine, phenobarbital, phenytoin, primidone -cimetidine -cyclosporine -male hormones, like estrogens or progestins and birth control pills, patches, rings, or injections -general anesthetics like halothane, isoflurane, methoxyflurane, propofol -grapefruit juice -local anesthetics like lidocaine, pramoxine, tetracaine -medicines that relax muscles for surgery -narcotic medicines for pain -other antiviral medicines for HIV or AIDS -phenothiazines like chlorpromazine, mesoridazine, prochlorperazine, thioridazine This list may not describe all possible interactions. Give your health care provider a list of all the medicines, herbs, non-prescription drugs, or dietary supplements you use. Also tell them if you smoke, drink alcohol, or use illegal drugs. Some items may interact with your medicine. What should I watch for while using this medicine? Tell your doctor or health care professional if your symptoms do not start to get better or if they get worse. Do not stop taking except on your doctor's advice. You may develop a severe reaction. Your doctor will tell you how much medicine to take. You may get drowsy or dizzy. Do not drive, use machinery, or do anything that needs mental alertness until you know how this medicine affects you. To reduce the risk of dizzy and fainting spells, do not stand or sit up quickly, especially if you are an older patient.  Alcohol may increase dizziness and drowsiness. Avoid alcoholic  drinks. If you are taking another medicine that also causes drowsiness, you may have more side effects. Give your health care provider a list of all medicines you use. Your doctor will tell you how much medicine to take. Do not take more medicine than directed. Call emergency for help if you have problems breathing or unusual sleepiness. What side effects may I notice from receiving this medicine? Side effects that you should report to your doctor or health care professional as soon as possible: -allergic reactions like skin rash, itching or hives, swelling of the face, lips, or tongue -breathing problems -confusion -loss of balance or coordination -signs and symptoms of low blood pressure like dizziness; feeling faint or lightheaded, falls; unusually weak or tired -suicidal thoughts or other mood changes Side effects that usually do not require medical attention (report to your doctor or health care professional if they continue or are bothersome): -dizziness -dry mouth -nausea, vomiting -tiredness This list may not describe all possible side effects. Call your doctor for medical advice about side effects. You may report side effects to FDA at 1-800-FDA-1088. Where should I keep my medicine? Keep out of the reach of children. This medicine can be abused. Keep your medicine in a safe place to protect it from theft. Do not share this medicine with anyone. Selling or giving away this medicine is dangerous and against the law. Store at room temperature between 20 and 25 degrees C (68 and 77 degrees F). This medicine may cause accidental overdose and death if taken by other adults, children, or pets. Mix any unused medicine with a substance like cat litter or coffee grounds. Then throw the medicine away in a sealed container like a sealed bag or a coffee can with a lid. Do not use the medicine after the expiration date. NOTE: This sheet is  a summary. It may not cover all possible information. If you have questions about this medicine, talk to your doctor, pharmacist, or health care provider.  2018 Elsevier/Gold Standard (2015-02-12 13:47:25)     Hypertension Hypertension, commonly called high blood pressure, is when the force of blood pumping through the arteries is too strong. The arteries are the blood vessels that carry blood from the heart throughout the body. Hypertension forces the heart to work harder to pump blood and may cause arteries to become narrow or stiff. Having untreated or uncontrolled hypertension can cause heart attacks, strokes, kidney disease, and other problems. A blood pressure reading consists of a higher number over a lower number. Ideally, your blood pressure should be below 120/80. The first ("top") number is called the systolic pressure. It is a measure of the pressure in your arteries as your heart beats. The second ("bottom") number is called the diastolic pressure. It is a measure of the pressure in your arteries as the heart relaxes. What are the causes? The cause of this condition is not known. What increases the risk? Some risk factors for high blood pressure are under your control. Others are not. Factors you can change  Smoking.  Having type 2 diabetes mellitus, high cholesterol, or both.  Not getting enough exercise or physical activity.  Being overweight.  Having too much fat, sugar, calories, or salt (sodium) in your diet.  Drinking too much alcohol. Factors that are difficult or impossible to change  Having chronic kidney disease.  Having a family history of high blood pressure.  Age. Risk increases with age.  Race. You may be at higher risk if you are  African-American.  Gender. Men are at higher risk than women before age 43. After age 73, women are at higher risk than men.  Having obstructive sleep apnea.  Stress. What are the signs or symptoms? Extremely high blood  pressure (hypertensive crisis) may cause:  Headache.  Anxiety.  Shortness of breath.  Nosebleed.  Nausea and vomiting.  Severe chest pain.  Jerky movements you cannot control (seizures).  How is this diagnosed? This condition is diagnosed by measuring your blood pressure while you are seated, with your arm resting on a surface. The cuff of the blood pressure monitor will be placed directly against the skin of your upper arm at the level of your heart. It should be measured at least twice using the same arm. Certain conditions can cause a difference in blood pressure between your right and left arms. Certain factors can cause blood pressure readings to be lower or higher than normal (elevated) for a short period of time:  When your blood pressure is higher when you are in a health care provider's office than when you are at home, this is called white coat hypertension. Most people with this condition do not need medicines.  When your blood pressure is higher at home than when you are in a health care provider's office, this is called masked hypertension. Most people with this condition may need medicines to control blood pressure.  If you have a high blood pressure reading during one visit or you have normal blood pressure with other risk factors:  You may be asked to return on a different day to have your blood pressure checked again.  You may be asked to monitor your blood pressure at home for 1 week or longer.  If you are diagnosed with hypertension, you may have other blood or imaging tests to help your health care provider understand your overall risk for other conditions. How is this treated? This condition is treated by making healthy lifestyle changes, such as eating healthy foods, exercising more, and reducing your alcohol intake. Your health care provider may prescribe medicine if lifestyle changes are not enough to get your blood pressure under control, and if:  Your  systolic blood pressure is above 130.  Your diastolic blood pressure is above 80.  Your personal target blood pressure may vary depending on your medical conditions, your age, and other factors. Follow these instructions at home: Eating and drinking  Eat a diet that is high in fiber and potassium, and low in sodium, added sugar, and fat. An example eating plan is called the DASH (Dietary Approaches to Stop Hypertension) diet. To eat this way: ? Eat plenty of fresh fruits and vegetables. Try to fill half of your plate at each meal with fruits and vegetables. ? Eat whole grains, such as whole wheat pasta, brown rice, or whole grain bread. Fill about one quarter of your plate with whole grains. ? Eat or drink low-fat dairy products, such as skim milk or low-fat yogurt. ? Avoid fatty cuts of meat, processed or cured meats, and poultry with skin. Fill about one quarter of your plate with lean proteins, such as fish, chicken without skin, beans, eggs, and tofu. ? Avoid premade and processed foods. These tend to be higher in sodium, added sugar, and fat.  Reduce your daily sodium intake. Most people with hypertension should eat less than 1,500 mg of sodium a day.  Limit alcohol intake to no more than 1 drink a day for nonpregnant women and 2  drinks a day for men. One drink equals 12 oz of beer, 5 oz of wine, or 1 oz of hard liquor. Lifestyle  Work with your health care provider to maintain a healthy body weight or to lose weight. Ask what an ideal weight is for you.  Get at least 30 minutes of exercise that causes your heart to beat faster (aerobic exercise) most days of the week. Activities may include walking, swimming, or biking.  Include exercise to strengthen your muscles (resistance exercise), such as pilates or lifting weights, as part of your weekly exercise routine. Try to do these types of exercises for 30 minutes at least 3 days a week.  Do not use any products that contain nicotine or  tobacco, such as cigarettes and e-cigarettes. If you need help quitting, ask your health care provider.  Monitor your blood pressure at home as told by your health care provider.  Keep all follow-up visits as told by your health care provider. This is important. Medicines  Take over-the-counter and prescription medicines only as told by your health care provider. Follow directions carefully. Blood pressure medicines must be taken as prescribed.  Do not skip doses of blood pressure medicine. Doing this puts you at risk for problems and can make the medicine less effective.  Ask your health care provider about side effects or reactions to medicines that you should watch for. Contact a health care provider if:  You think you are having a reaction to a medicine you are taking.  You have headaches that keep coming back (recurring).  You feel dizzy.  You have swelling in your ankles.  You have trouble with your vision. Get help right away if:  You develop a severe headache or confusion.  You have unusual weakness or numbness.  You feel faint.  You have severe pain in your chest or abdomen.  You vomit repeatedly.  You have trouble breathing. Summary  Hypertension is when the force of blood pumping through your arteries is too strong. If this condition is not controlled, it may put you at risk for serious complications.  Your personal target blood pressure may vary depending on your medical conditions, your age, and other factors. For most people, a normal blood pressure is less than 120/80.  Hypertension is treated with lifestyle changes, medicines, or a combination of both. Lifestyle changes include weight loss, eating a healthy, low-sodium diet, exercising more, and limiting alcohol. This information is not intended to replace advice given to you by your health care provider. Make sure you discuss any questions you have with your health care provider. Document Released:  05/16/2005 Document Revised: 04/13/2016 Document Reviewed: 04/13/2016 Elsevier Interactive Patient Education  2018 [employer].    Cholesterol Cholesterol is a white, waxy, fat-like substance that is needed by the human body in small amounts. The liver makes all the cholesterol we need. Cholesterol is carried from the liver by the blood through the blood vessels. Deposits of cholesterol (plaques) may build up on blood vessel (artery) walls. Plaques make the arteries narrower and stiffer. Cholesterol plaques increase the risk for heart attack and stroke. You cannot feel your cholesterol level even if it is very high. The only way to know that it is high is to have a blood test. Once you know your cholesterol levels, you should keep a record of the test results. Work with your health care provider to keep your levels in the desired range. What do the results mean?  Total cholesterol is  a rough measure of all the cholesterol in your blood.  LDL (low-density lipoprotein) is the "bad" cholesterol. This is the type that causes plaque to build up on the artery walls. You want this level to be low.  HDL (high-density lipoprotein) is the "good" cholesterol because it cleans the arteries and carries the LDL away. You want this level to be high.  Triglycerides are fat that the body can either burn for energy or store. High levels are closely linked to heart disease. What are the desired levels of cholesterol?  Total cholesterol below 200.  LDL below 100 for people who are at risk, below 70 for people at very high risk.  HDL above 40 is good. A level of 60 or higher is considered to be protective against heart disease.  Triglycerides below 150. How can I lower my cholesterol? Diet Follow your diet program as told by your health care provider.  Choose fish or white meat chicken and [place], roasted or baked. Limit fatty cuts of red meat, fried foods, and processed meats, such as sausage and lunch  meats.  Eat lots of fresh fruits and vegetables.  Choose whole grains, beans, pasta, potatoes, and cereals.  Choose olive oil, corn oil, or canola oil, and use only small amounts.  Avoid butter, mayonnaise, shortening, or palm kernel oils.  Avoid foods with trans fats.  Drink skim or nonfat milk and eat low-fat or nonfat yogurt and cheeses. Avoid whole milk, cream, ice cream, egg yolks, and full-fat cheeses.  Healthier desserts include angel food cake, ginger snaps, animal crackers, hard candy, popsicles, and low-fat or nonfat frozen yogurt. Avoid pastries, cakes, pies, and cookies.  Exercise  Follow your exercise program as told by your health care provider. A regular program: ? Helps to decrease LDL and raise HDL. ? Helps with weight control.  Do things that increase your activity level, such as gardening, walking, and taking the stairs.  Ask your health care provider about ways that you can be more active in your daily life.  Medicine  Take over-the-counter and prescription medicines only as told by your health care provider. ? Medicine may be prescribed by your health care provider to help lower cholesterol and decrease the risk for heart disease. This is usually done if diet and exercise have failed to bring down cholesterol levels. ? If you have several risk factors, you may need medicine even if your levels are normal.  This information is not intended to replace advice given to you by your health care provider. Make sure you discuss any questions you have with your health care provider. Document Released: 02/08/2001 Document Revised: 12/12/2015 Document Reviewed: 11/14/2015 Elsevier Interactive Patient Education  2017 [employer]   IF you received an x-ray today, you will [facility] an invoice [facility] Please contact [facility] at [phone] with questions or concerns regarding your invoice.   IF you received labwork today, you will receive  an invoice from [facility]. Please contact [facility] at [phone] with questions or concerns regarding your invoice.   Our billing staff will not be able to assist you with questions regarding bills from these companies.  You will be contacted with the lab results as soon as they are available. The fastest way to get your results is to activate your My Chart account. Instructions are located on the last page of this paperwork. If you have not heard from us regarding the results in 2 weeks, please contact this office.

## 2016-12-23 NOTE — Progress Notes (Signed)
MRN: [id number] DOB: 07/15/1965  Subjective:   Jeremiah [name] is a 51 y.o. male presenting for chief complaint of Medication Refill (Amlodipine, Lipitor, Valium, Ibuprofen, Losartan, Prilosec)  HTN - Managed well with amlodipine, los-HCTZ. Tries to eat healthily, stays active with work. Denies smoking cigarettes. Has ~2 glasses of wine per night. He has had a cardiac work up due to his risk factors. His work up was negative including heart cath and stress test. Denies dizziness, chronic headache, blurred vision, chest pain, shortness of breath, heart racing, nausea, vomiting, abdominal pain, hematuria, lower leg swelling.   HL - Managed with atorvastatin. Tries to eat shakes to eat healthily but also eats heavy meals for dinner. Denies myalgia associated with statin therapy.  GERD - Patient had endoscopy done and was normal. He still uses Prilosec and Pepcid (as needed). Patient does shakes during the day. Eats hot, spicy food and has a glass of wine. Has GERD after this but is helped with Prilosec and occasional Pepcid.   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 He has previously used Valium successfully. Has never used more than one per day. Denies history of alcohol abuse, dependence but admits that there was a period of time where he was drinking more heavily due to stress from finances and losing contracts with his work. He denies drinking more than 2 glasses of wine per night and admits that if he uses Valium he will not drink alcohol. Denies depressed mood, SI, HI.   Cervical DDD - Has been seen by [facility]. He has had CT done, last 12/2015. Today, reports that he has daily nervous tick of his neck, neck popping, has numbness and tingling that shoots down his arms bilaterally. Has previously responded to a [facility]. They wanted to do surgery but patient refused. He has also responded well to a steroid taper.  He has been using ibuprofen 600mg  intermittently.  Jeremiah [name] has a current medication list which includes the following prescription(s): amlodipine, atorvastatin, diazepam, ibuprofen, losartan-hydrochlorothiazide, nitroglycerin, omeprazole, valacyclovir, zoster vaccine live (pf), famotidine, and sucralfate. Also has No Known Allergies.  Jeremiah [name]  has a past medical history of Alcohol dependence (HCC); Anxiety; Depression; Essential hypertension, benign; History of heavy alcohol consumption; HSV-2 (herpes simplex virus 2) infection; Hyperglycemia; Hyperlipidemia; Prostatitis; and Reflux. Also  has a past surgical history that includes Shoulder surgery (1983); Cardiac catheterization (2006); ORIF finger fracture (2009); Cholecystectomy (1990); Shoulder arthroscopy with subacromial decompression, rotator cuff repair and bicep tendon repair (Right, 07/09/2013); doppler echocardiography (11/2005); Cardiovascular stress test (07/2011); and left heart catheterization with coronary angiogram (N/A, 02/14/2014).His family history includes Cancer in his mother; Heart attack in his maternal grandfather and maternal grandmother; Heart disease in his father; Hyperlipidemia in his father, mother, sister, and sister; Hypertension in his father, mother, sister, and sister; Stroke in his maternal grandmother.   Objective:   Vitals: BP 119/85   Pulse 83   Temp 98.1 F (36.7 C) (Oral)   Resp 16   Ht 5' 8.5" (1.74 m)   Wt 178 lb 12.8 oz (81.1 kg)   SpO2 97%   BMI 26.79 kg/m   BP Readings from Last 3 Encounters:  12/23/16 119/85  01/25/16 126/82  09/15/15 128/76   Physical Exam  Constitutional: He is oriented to person, place, and time. He appears well-developed and well-nourished.  HENT:  Mouth/Throat: Oropharynx is clear and moist.  Eyes: Pupils are equal, round, and reactive to light. No  scleral icterus.  Neck: Normal range of motion. Neck supple. No thyromegaly present.  Cardiovascular: Normal rate, regular rhythm  and intact distal pulses.  Exam reveals no gallop and no friction rub.   No murmur heard. Pulmonary/Chest: No respiratory distress. He has no wheezes. He has no rales.  Abdominal: Soft. Bowel sounds are normal. He exhibits no distension and no mass. There is no tenderness. There is no guarding.  Musculoskeletal: He exhibits no edema.       Cervical back: He exhibits normal range of motion, no tenderness, no bony tenderness, no swelling, no edema, no deformity and no spasm.  Neurological: He is alert and oriented to person, place, and time. He displays normal reflexes. Coordination normal.  Skin: Skin is warm and dry. Capillary refill takes less than 2 seconds.  Psychiatric: He has a normal mood and affect.    Assessment and Plan :   1. Essential hypertension - Refills provided, labs pending, follow up in 6 months. - Comprehensive metabolic panel - Microalbumin/Creatinine Ratio, Urine - amLODipine (NORVASC) 5 MG tablet; Take 1 tablet (5 mg total) by mouth daily.  Dispense: 90 tablet; Refill: 1 - losartan-hydrochlorothiazide (HYZAAR) 100-12.5 MG tablet; Take 1 tablet by mouth daily.  Dispense: 90 tablet; Refill: 1  2. Mixed hyperlipidemia - Lipid panel pending, refills provided, f/u as above.  3. Gastroesophageal reflux disease, esophagitis presence not specified - Stable, patient understands that the foods he eats are affecting his GERD. I also counseled on use of ibuprofen, alcohol adding to this. He verbalized understanding but is okay with continued use of Prilosec and Pepcid.  4. Degenerative disc disease, cervical 5. Cervical disc disorder with radiculopathy of cervical region - Start short steroid course. Thereafter patient may use ibuprofen 1-2 times weekly. RTC if symptoms persist. - predniSONE (DELTASONE) 20 MG tablet; Take 2 tablets (40 mg total) by mouth daily with breakfast.  Dispense: 10 tablet; Refill: 0  6. Anxiety - Stable. Counseled patient on potential for adverse  effects with medications prescribed today, patient verbalized understanding. I agreed to refill patient's benzo but will switch to Xanax 0.5mg  tablet, not to have more than 1 daily. Patient agreed to obtain scripts for Xanax only from me. I also advised patient that he is not to obtain other controlled substances as it increases risks for adverse effects. Patient will let me know if he does well with Xanax monotherapy. If not, we will switch back to Valium.  Wallis BambergMario Dellar Traber, PA-C Primary Care at [facility] [phone] 12/23/2016  3:12 PM

## 2016-12-24 LAB — COMPREHENSIVE METABOLIC PANEL
ALK PHOS: 71 IU/L (ref 39–117)
ALT: 30 IU/L (ref 0–44)
AST: 31 IU/L (ref 0–40)
Albumin/Globulin Ratio: 2.2 (ref 1.2–2.2)
Albumin: 5.2 g/dL (ref 3.5–5.5)
BILIRUBIN TOTAL: 0.9 mg/dL (ref 0.0–1.2)
BUN / CREAT RATIO: 22 — AB (ref 9–20)
BUN: 17 mg/dL (ref 6–24)
CO2: 26 mmol/L (ref 20–29)
CREATININE: 0.76 mg/dL (ref 0.76–1.27)
Calcium: 10.2 mg/dL (ref 8.7–10.2)
Chloride: 100 mmol/L (ref 96–106)
GFR calc Af Amer: 120 mL/min/{1.73_m2} (ref >59)
GFR calc non Af Amer: 104 mL/min/{1.73_m2} (ref >59)
GLUCOSE: 94 mg/dL (ref 65–99)
Globulin, Total: 2.4 g/dL (ref 1.5–4.5)
Potassium: 4.3 mmol/L (ref 3.5–5.2)
SODIUM: 141 mmol/L (ref 134–144)
Total Protein: 7.6 g/dL (ref 6.0–8.5)

## 2016-12-24 LAB — MICROALBUMIN / CREATININE URINE RATIO
Creatinine, Urine: 32.7 mg/dL
Microalb/Creat Ratio: <9.2 mg/g creat (ref 0.0–30.0)
Microalbumin, Urine: <3 ug/mL

## 2016-12-24 LAB — LIPID PANEL
CHOL/HDL RATIO: 2.8 ratio (ref 0.0–5.0)
Cholesterol, Total: 204 mg/dL — ABNORMAL HIGH (ref 100–199)
HDL: 74 mg/dL (ref >39)
LDL Calculated: 105 mg/dL — ABNORMAL HIGH (ref 0–99)
Triglycerides: 124 mg/dL (ref 0–149)
VLDL CHOLESTEROL CAL: 25 mg/dL (ref 5–40)

## 2016-12-26 NOTE — Telephone Encounter (Signed)
Prescription Shredded, never picked up

## 2017-01-03 ENCOUNTER — Encounter: Payer: Self-pay | Admitting: Family Medicine

## 2017-01-03 ENCOUNTER — Ambulatory Visit (INDEPENDENT_AMBULATORY_CARE_PROVIDER_SITE_OTHER): Payer: No Typology Code available for payment source | Admitting: Family Medicine

## 2017-01-03 VITALS — BP 130/93 | HR 75 | Temp 98.8°F | Resp 16 | Ht 68.0 in | Wt 178.0 lb

## 2017-01-03 DIAGNOSIS — R42 Dizziness and giddiness: Secondary | ICD-10-CM | POA: Diagnosis not present

## 2017-01-03 DIAGNOSIS — H538 Other visual disturbances: Secondary | ICD-10-CM | POA: Diagnosis not present

## 2017-01-03 DIAGNOSIS — K219 Gastro-esophageal reflux disease without esophagitis: Secondary | ICD-10-CM

## 2017-01-03 DIAGNOSIS — I10 Essential (primary) hypertension: Secondary | ICD-10-CM | POA: Diagnosis not present

## 2017-01-03 LAB — GLUCOSE, POCT (MANUAL RESULT ENTRY): POC Glucose: 85 mg/dl (ref 70–99)

## 2017-01-03 LAB — POCT CBC
GRANULOCYTE PERCENT: 65.6 % (ref 37–80)
HCT, POC: 44.9 % (ref 43.5–53.7)
HEMOGLOBIN: 15.5 g/dL (ref 14.1–18.1)
Lymph, poc: 1.6 (ref 0.6–3.4)
MCH: 31.3 pg — AB (ref 27–31.2)
MCHC: 34.5 g/dL (ref 31.8–35.4)
MCV: 90.8 fL (ref 80–97)
MID (CBC): 0.3 (ref 0–0.9)
MPV: 7.6 fL (ref 0–99.8)
PLATELET COUNT, POC: 207 10*3/uL (ref 142–424)
POC GRANULOCYTE: 3.6 (ref 2–6.9)
POC LYMPH PERCENT: 28.6 %L (ref 10–50)
POC MID %: 5.8 %M (ref 0–12)
RBC: 4.95 M/uL (ref 4.69–6.13)
RDW, POC: 12.6 %
WBC: 5.5 10*3/uL (ref 4.6–10.2)

## 2017-01-03 MED ORDER — PANTOPRAZOLE SODIUM 40 MG PO TBEC: 40.0000 mg | DELAYED_RELEASE_TABLET | Freq: Every day | ORAL | 3 refills | Status: DC

## 2017-01-03 NOTE — Progress Notes (Addendum)
Subjective:  By signing my name below, I, Jeremiah [name], attest that this documentation has been prepared under the direction and in the presence of Jeremiah Ray, MD. Electronically Signed: Moises [name], [place]. 01/03/2017 , 5:51 PM .  Patient was seen in Room 11 .   Patient ID: Jeremiah [name], male    DOB: 1965/09/16, 51 y.o.   MRN: [id number] Chief Complaint  Patient presents with  . Dizziness    x3 days had recent med refill  . Medication Refill    pantoprazole   HPI Jeremiah [name] is a 51 y.o. male  Here for dizziness. He has history of depression, hyperlipidemia, HTN and alcohol use. I last saw patient in 2013, but was seen by [name], PA-C on July 27th. His BP was 119/85 at that time. He was drinking 2 glasses of wine per night per that visit. Valium was discussed for anxiety at his last visit and agreed to not drink alcohol if he took valium. He was prescribed xanax 0.42m as needed. He was continued on same dose of antihypertensives. He presents today with dizziness.   Dizziness Patient complains of blurry vision bilaterally and dizziness that occurred out of the blue 2 days ago. He notes that he's never had these symptoms in the past. He denies weakness or slurred speech. He notes his eyes continuously being heavy. He states he was cutting grass earlier that day under the heat and sun, then went swimming in his pool to cool off. Afterwards, he had a glass of wine and suddenly a wave of dizziness hit him. He reports having similar symptoms when he had allergies, which caused him to feel disoriented and humming in his head. He denies alcohol use after that glass of wine. He denies tremors. He denies taken any xanax yet. He endorses worsening dizziness earlier today, but felt better over the past few hours. He was in bed mostly this morning.   Allergies With his allergies, he took benadryl in the past for relief. He hasn't taken benadryl recently, but tried zyrtec.   Heart  palpitations He mentions having heart palpitations a few weeks ago. He denies any chest pain. He had heart cath in 2015.   GERD He also notes his GERD medication recently changed from pantoprazole to omeprazole. He reports omeprazole giving him abdominal pains for first 2 days of taking it. He stopped taking it and the abdominal pain resolved. He requests refill of pantoprazole.   Weight loss supplement He takes Isagenix weight loss supplement, and has had darker stool since starting it. He's been taking it for a year now.   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 Loss of weight 01/26/2016  . Dysphagia 01/26/2016  . ESR raised 01/26/2016  . Rotator cuff tear, right 07/09/2013  . Essential hypertension, benign   . Depression   . Reflux   . Hyperlipidemia   . Alcohol dependence ([place]    Past Medical History:  Diagnosis Date  . Alcohol dependence ([place]   . Anxiety   . Depression   . Essential hypertension, benign   . History of heavy alcohol consumption   . HSV-2 (herpes simplex virus 2) infection   . Hyperglycemia   . Hyperlipidemia   . Prostatitis   . Reflux    Past Surgical History:  Procedure Laterality Date  . CARDIAC CATHETERIZATION  2006  . CARDIOVASCULAR STRESS TEST  07/2011  . CHOLECYSTECTOMY  1990  . DOPPLER ECHOCARDIOGRAPHY  11/2005  . LEFT HEART CATHETERIZATION WITH CORONARY ANGIOGRAM N/A 02/14/2014   Procedure: LEFT HEART CATHETERIZATION WITH CORONARY ANGIOGRAM;  Surgeon: [name], MD;  Location: [facility] CATH LAB;  Service: Cardiovascular;  Laterality: N/A;  . ORIF FINGER FRACTURE  2009   rt ring  . SHOULDER ARTHROSCOPY WITH SUBACROMIAL DECOMPRESSION, ROTATOR CUFF REPAIR AND BICEP TENDON REPAIR Right 07/09/2013   Procedure: RIGHT SHOULDER ARTHROSCOPY WITH SUBACROMIAL DECOMPRESSION, DEBRIDEMENT,  SUBSCAPULARIS TENDON REPAIR;  Surgeon: [name], MD;  Location: [place];  Service: Orthopedics;  Laterality: Right;  . SHOULDER SURGERY  1983   left   No Known Allergies Prior to Admission medications   Medication Sig Start Date End Date Taking? Authorizing Provider  ALPRAZolam [name]) 0.5 MG tablet Take 1 tablet (0.5 mg total) by mouth at bedtime as needed for anxiety. 12/23/16  Yes [name], PA-C  amLODipine (NORVASC) 5 MG tablet Take 1 tablet (5 mg total) by mouth daily. 12/23/16  Yes [name], PA-C  atorvastatin (LIPITOR) 20 MG tablet TAKE ONE TABLET BY MOUTH ONCE DAILY 10/26/16  Yes Jeffery, Chelle, PA-C  famotidine (PEPCID) 20 MG tablet Take 1 tablet (20 mg total) by mouth daily as needed for heartburn or indigestion. 12/23/16  Yes [name], PA-C  ibuprofen (ADVIL,MOTRIN) 600 MG tablet Take 1 every 8 hours as needed for back pain. 12/23/16  Yes [name], PA-C  losartan-hydrochlorothiazide (HYZAAR) 100-12.5 MG tablet Take 1 tablet by mouth daily. 12/23/16  Yes [name], PA-C  predniSONE (DELTASONE) 20 MG tablet Take 2 tablets (40 mg total) by mouth daily with breakfast. 12/23/16  Yes [name], PA-C  valACYclovir (VALTREX) 1000 MG tablet One by mouth twice a day for 3 days 03/06/15  Yes [name], [name], MD  nitroGLYCERIN (NITROSTAT) 0.4 MG SL tablet Place 1 tablet (0.4 mg total) under the tongue every 5 (five) minutes as needed for chest pain. Patient not taking: Reported on 01/03/2017 01/20/14   [name], PA-C  omeprazole (PRILOSEC) 40 MG capsule Take 1 capsule (40 mg total) by mouth daily. Patient not taking: Reported on 01/03/2017 12/23/16   [name], PA-C  sucralfate (CARAFATE) 1 GM/10ML suspension Take 10 mLs (1 g total) by mouth 4 (four) times daily -  with meals and at bedtime. 03/13/15 03/20/15  [name], MD   Social History   Social History  . Marital status: Single    Spouse name: N/A  . Number of children: N/A  . Years of education: N/A   Occupational History  .  Not on file.   Social History Main Topics  . Smoking status: Former Smoker    Types: Cigarettes    Quit date: 07/02/1992  . Smokeless tobacco: Never Used     Comment: quit 10-15 yrs ago  . Alcohol use 0.0 oz/week     Comment: 6-12 pack nightly-6-7 beers,occ vodka,drinks every night  . Drug use: No  . Sexual activity: Yes   Other Topics Concern  . Not on file   Social History Narrative  . No narrative on file   Review of Systems  Constitutional: Negative for fatigue and unexpected weight change.  Eyes: Positive for visual disturbance.  Respiratory: Negative for cough, chest tightness and shortness of breath.   Cardiovascular: Positive for palpitations. Negative for chest pain and leg swelling.  Gastrointestinal: Negative for abdominal pain and [name] in  stool.  Neurological: Positive for dizziness. Negative for tremors, speech difficulty, weakness, light-headedness and headaches.       Objective:   Physical Exam  Constitutional: He is oriented to person, place, and time. He appears well-developed and well-nourished. No distress.  HENT:  Head: Normocephalic and atraumatic.  Eyes: Pupils are equal, round, and reactive to light. EOM are normal. Right eye exhibits normal extraocular motion and no nystagmus. Left eye exhibits normal extraocular motion and no nystagmus.  Neck: Neck supple.  Cardiovascular: Normal rate.   Pulmonary/Chest: Effort normal. No respiratory distress.  Musculoskeletal: Normal range of motion.  Neurological: He is alert and oriented to person, place, and time. He displays a negative Romberg sign. Coordination normal.  No pronator drift, normal finger-to-nose, normal heel-toe gait, equal upper and lower extremity strength, equal facial movement, no facial droop  Skin: Skin is warm and dry.  Psychiatric: He has a normal mood and affect. His behavior is normal.  Nursing note and vitals reviewed.   Vitals:   01/03/17 1620  BP: (!) 130/93  Pulse: 75  Resp:  16  Temp: 98.8 F (37.1 C)  TempSrc: Oral  SpO2: 98%  Weight: 178 lb (80.7 kg)  Height: 5' 8" (1.727 m)    Visual Acuity Screening   Right eye Left eye Both eyes  Without correction: 20/25 20/30 20/25  With correction:       Orthostatic VS for the past 24 hrs (Last 3 readings):  BP- Lying Pulse- Lying BP- Sitting Pulse- Sitting BP- Standing at 0 minutes Pulse- Standing at 0 minutes  01/03/17 1644 132/87 74 (!) 133/95 75 (!) 127/91 81   EKG: sinus rhythm rate 64, no acute findings.   Results for orders placed or performed in visit on 01/03/17  POCT glucose (manual entry)  Result Value Ref Range   POC Glucose 85 70 - 99 mg/dl  POCT CBC  Result Value Ref Range   WBC 5.5 4.6 - 10.2 K/uL   Lymph, poc 1.6 0.6 - 3.4   POC LYMPH PERCENT 28.6 10 - 50 %L   MID (cbc) 0.3 0 - 0.9   POC MID % 5.8 0 - 12 %M   POC Granulocyte 3.6 2 - 6.9   Granulocyte percent 65.6 37 - 80 %G   RBC 4.95 4.69 - 6.13 M/uL   Hemoglobin 15.5 14.1 - 18.1 g/dL   HCT, POC 44.9 43.5 - 53.7 %   MCV 90.8 80 - 97 fL   MCH, POC 31.3 (A) 27 - 31.2 pg   MCHC 34.5 31.8 - 35.4 g/dL   RDW, POC 12.6 %   Platelet Count, POC 207 142 - 424 K/uL   MPV 7.6 0 - 99.8 fL       Assessment & Plan:    JAMIE BELGER is a 52 y.o. male Dizziness - Plan: POCT glucose (manual entry), EKG 12-Lead, POCT CBC, Basic metabolic panel Blurred vision - Plan: POCT glucose (manual entry), EKG 12-Lead, POCT CBC, Basic metabolic panel Essential hypertension  -Nonfocal neurologic exam. Denies focal weakness/slurred speech. Vision testing overall reassuring CBC, glucose, orthostatics reassuring. Reports some similar symptoms in past with allergies. Heat illness or relative volume depletion also possible. Reports some improvement.   -Okay to treat allergies with Claritin, Zyrtec or Allegra for now, and if not improving or any new neurologic symptoms, would recommend emergency room evaluation for possible neuroimaging.  -Advised to stop any  over-the-counter supplements for now.   Gastroesophageal reflux disease, esophagitis presence not  specified - Plan: pantoprazole (PROTONIX) 40 MG tablet  -Attributes possible change in symptoms above to switch from Protonix to omeprazole. Less likely cause of symptoms, but for now will switch back to Protonix 40 mg daily.   Meds ordered this encounter  Medications  . pantoprazole (PROTONIX) 40 MG tablet    Sig: Take 1 tablet (40 mg total) by mouth daily.    Dispense:  30 tablet    Refill:  3   Patient Instructions   I recommend against the weight loss supplements until we can make sure that is safe for you to take, especially with [name] pressure medication.  Although it is an unlikely cause of your symptoms, okay to stop omeprazole for now, and restart pantoprazole.  Make sure you stay well hydrated, and be careful with exposure to heat as heat illness can sometimes cause dizziness, and some of the other symptoms you are feeling.  I will check your electrolytes, but those were normal when most recently checked by [name], PA-C.  Okay to take Allegra, Claritin, or Zyrtec for allergies, Flonase nasal spray as needed, but if dizziness is not continuing to improve tonight or into tomorrow, would recommend follow up to discuss other possible evaluation (including possible imaging with CT scan, or evaluation with eye specialist)  Return to the clinic or go to the nearest emergency room if any of your symptoms worsen or new symptoms occur.    Dizziness Dizziness is a common problem. It is a feeling of unsteadiness or light-headedness. You may feel like you are about to faint. Dizziness can lead to injury if you stumble or fall. Anyone can become dizzy, but dizziness is more common in older adults. This condition can be caused by a number of things, including medicines, dehydration, or illness. Follow these instructions at home: Taking these steps may help with your condition: Eating and  drinking  Drink enough fluid to keep your urine clear or pale yellow. This helps to keep you from becoming dehydrated. Try to drink more clear fluids, such as water.  Do not drink alcohol.  Limit your caffeine intake if directed by your health care provider.  Limit your salt intake if directed by your health care provider. Activity  Avoid making quick movements. ? Rise slowly from chairs and steady yourself until you feel okay. ? In the morning, first sit up on the side of the bed. When you feel okay, stand slowly while you hold onto something until you know that your balance is fine.  Move your legs often if you need to stand in one place for a long time. Tighten and relax your muscles in your legs while you are standing.  Do not drive or operate heavy machinery if you feel dizzy.  Avoid bending down if you feel dizzy. Place items in your home so that they are easy for you to reach without leaning over. Lifestyle  Do not use any tobacco products, including cigarettes, chewing tobacco, or electronic cigarettes. If you need help quitting, ask your health care provider.  Try to reduce your stress level, such as with yoga or meditation. Talk with your health care provider if you need help. General instructions  Watch your dizziness for any changes.  Take medicines only as directed by your health care provider. Talk with your health care provider if you think that your dizziness is caused by a medicine that you are taking.  Tell a friend or a family member that you are feeling dizzy.  If he or she notices any changes in your behavior, have this person call your health care provider.  Keep all follow-up visits as directed by your health care provider. This is important. Contact a health care provider if:  Your dizziness does not go away.  Your dizziness or light-headedness gets worse.  You feel nauseous.  You have reduced hearing.  You have new symptoms.  You are unsteady on  your feet or you feel like the room is spinning. Get help right away if:  You vomit or have diarrhea and are unable to eat or drink anything.  You have problems talking, walking, swallowing, or using your arms, hands, or legs.  You feel generally weak.  You are not thinking clearly or you have trouble forming sentences. It may take a friend or family member to notice this.  You have chest pain, abdominal pain, shortness of breath, or sweating.  Your vision changes.  You notice any bleeding.  You have a headache.  You have neck pain or a stiff neck.  You have a fever. This information is not intended to replace advice given to you by your health care provider. Make sure you discuss any questions you have with your health care provider. Document Released: 11/09/2000 Document Revised: 10/22/2015 Document Reviewed: 05/12/2014 Elsevier Interactive Patient Education  2017 [employer].    IF you received an x-[name] today, you will receive an invoice from [facility]. Please contact [facility] at [phone] with questions or concerns regarding your invoice.   IF you received labwork today, you will receive an invoice from [facility]. Please contact [facility] at [phone] with questions or concerns regarding your invoice.   Our billing staff will not be able to assist you with questions regarding bills from these companies.  You will be contacted with the lab results as soon as they are available. The fastest way to get your results is to activate your My Chart account. Instructions are located on the last page of this paperwork. If you have not heard from [place] regarding the results in 2 weeks, please contact this office.       I personally performed the services described in this documentation, which was scribed in my presence. The recorded information has been reviewed and considered for accuracy and completeness, addended by me as needed, and agree with  information above.  Signed,   Jeremiah Ray, MD Primary Care at [place].  01/06/17 10:21 AM

## 2017-01-03 NOTE — Patient Instructions (Addendum)
I recommend against the weight loss supplements until we can make sure that is safe for you to take, especially with blood pressure medication.  Although it is an unlikely cause of your symptoms, okay to stop omeprazole for now, and restart pantoprazole.  Make sure you stay well hydrated, and be careful with exposure to heat as heat illness can sometimes cause dizziness, and some of the other symptoms you are feeling.  I will check your electrolytes, but those were normal when most recently checked by Wallis Bamberg, PA-C.  Okay to take Allegra, Claritin, or Zyrtec for allergies, Flonase nasal spray as needed, but if dizziness is not continuing to improve tonight or into tomorrow, would recommend follow up to discuss other possible evaluation (including possible imaging with CT scan, or evaluation with eye specialist)  Return to the clinic or go to the nearest emergency room if any of your symptoms worsen or new symptoms occur.    Dizziness Dizziness is a common problem. It is a feeling of unsteadiness or light-headedness. You may feel like you are about to faint. Dizziness can lead to injury if you stumble or fall. Anyone can become dizzy, but dizziness is more common in older adults. This condition can be caused by a number of things, including medicines, dehydration, or illness. Follow these instructions at home: Taking these steps may help with your condition: Eating and drinking  Drink enough fluid to keep your urine clear or pale yellow. This helps to keep you from becoming dehydrated. Try to drink more clear fluids, such as water.  Do not drink alcohol.  Limit your caffeine intake if directed by your health care provider.  Limit your salt intake if directed by your health care provider. Activity  Avoid making quick movements. ? Rise slowly from chairs and steady yourself until you feel okay. ? In the morning, first sit up on the side of the bed. When you feel okay, stand slowly  while you hold onto something until you know that your balance is fine.  Move your legs often if you need to stand in one place for a long time. Tighten and relax your muscles in your legs while you are standing.  Do not drive or operate heavy machinery if you feel dizzy.  Avoid bending down if you feel dizzy. Place items in your home so that they are easy for you to reach without leaning over. Lifestyle  Do not use any tobacco products, including cigarettes, chewing tobacco, or electronic cigarettes. If you need help quitting, ask your health care provider.  Try to reduce your stress level, such as with yoga or meditation. Talk with your health care provider if you need help. General instructions  Watch your dizziness for any changes.  Take medicines only as directed by your health care provider. Talk with your health care provider if you think that your dizziness is caused by a medicine that you are taking.  Tell a friend or a family member that you are feeling dizzy. If he or she notices any changes in your behavior, have this person call your health care provider.  Keep all follow-up visits as directed by your health care provider. This is important. Contact a health care provider if:  Your dizziness does not go away.  Your dizziness or light-headedness gets worse.  You feel nauseous.  You have reduced hearing.  You have new symptoms.  You are unsteady on your feet or you feel like the room is spinning. Get help  right away if:  You vomit or have diarrhea and are unable to eat or drink anything.  You have problems talking, walking, swallowing, or using your arms, hands, or legs.  You feel generally weak.  You are not thinking clearly or you have trouble forming sentences. It may take a friend or family member to notice this.  You have chest pain, abdominal pain, shortness of breath, or sweating.  Your vision changes.  You notice any bleeding.  You have a  headache.  You have neck pain or a stiff neck.  You have a fever. This information is not intended to replace advice given to you by your health care provider. Make sure you discuss any questions you have with your health care provider. Document Released: 11/09/2000 Document Revised: 10/22/2015 Document Reviewed: 05/12/2014 Elsevier Interactive Patient Education  2017 [employer]    IF you received an x-ray today, you will receive an invoice from [facility]. Please contact [facility] at [phone] with questions or concerns regarding your invoice.   IF you received labwork today, you will receive an invoice from [facility]. Please contact [facility] at [phone] with questions or concerns regarding your invoice.   Our billing staff will not be able to assist you with questions regarding bills from these companies.  You will be contacted with the lab results as soon as they are available. The fastest way to get your results is to activate your My Chart account. Instructions are located on the last page of this paperwork. If you have not heard from us regarding the results in 2 weeks, please contact this office.

## 2017-01-04 LAB — BASIC METABOLIC PANEL
BUN/Creatinine Ratio: 13 (ref 9–20)
BUN: 10 mg/dL (ref 6–24)
CALCIUM: 10.1 mg/dL (ref 8.7–10.2)
CHLORIDE: 103 mmol/L (ref 96–106)
CO2: 25 mmol/L (ref 20–29)
Creatinine, Ser: 0.79 mg/dL (ref 0.76–1.27)
GFR calc non Af Amer: 103 mL/min/{1.73_m2} (ref >59)
GFR, EST AFRICAN AMERICAN: 119 mL/min/{1.73_m2} (ref >59)
Glucose: 91 mg/dL (ref 65–99)
Potassium: 3.8 mmol/L (ref 3.5–5.2)
Sodium: 144 mmol/L (ref 134–144)

## 2017-01-06 ENCOUNTER — Encounter: Payer: Self-pay | Admitting: Radiology

## 2017-03-16 ENCOUNTER — Other Ambulatory Visit: Payer: Self-pay | Admitting: Physician Assistant

## 2017-03-16 ENCOUNTER — Other Ambulatory Visit: Payer: Self-pay | Admitting: Urgent Care

## 2017-03-16 DIAGNOSIS — E782 Mixed hyperlipidemia: Secondary | ICD-10-CM

## 2017-03-17 NOTE — Telephone Encounter (Signed)
If he can wait f Jeremiah GibsonMani return 10/23, then we can wait. [facility]  He would like to know how he is doing with the xanax.  Please ask, route.  If he needs it before than please go ahead and fill under my name.

## 2017-03-21 NOTE — Telephone Encounter (Signed)
Refill provided, please notify patient script is ready for pick up. Patient due for follow up in 06/2017. Please send any additional refill requests to me.

## 2017-06-07 ENCOUNTER — Ambulatory Visit: Payer: PRIVATE HEALTH INSURANCE | Admitting: Emergency Medicine

## 2017-06-07 ENCOUNTER — Encounter: Payer: Self-pay | Admitting: Emergency Medicine

## 2017-06-07 VITALS — BP 122/80 | HR 84 | Temp 98.3°F | Resp 18 | Ht 68.0 in | Wt 187.8 lb

## 2017-06-07 DIAGNOSIS — E782 Mixed hyperlipidemia: Secondary | ICD-10-CM

## 2017-06-07 DIAGNOSIS — R0981 Nasal congestion: Secondary | ICD-10-CM | POA: Diagnosis not present

## 2017-06-07 DIAGNOSIS — I10 Essential (primary) hypertension: Secondary | ICD-10-CM | POA: Diagnosis not present

## 2017-06-07 DIAGNOSIS — K219 Gastro-esophageal reflux disease without esophagitis: Secondary | ICD-10-CM

## 2017-06-07 MED ORDER — AZITHROMYCIN 250 MG PO TABS: ORAL_TABLET | ORAL | 0 refills | Status: DC

## 2017-06-07 MED ORDER — PANTOPRAZOLE SODIUM 40 MG PO TBEC: 40.0000 mg | DELAYED_RELEASE_TABLET | Freq: Every day | ORAL | 3 refills | Status: DC

## 2017-06-07 MED ORDER — ATORVASTATIN CALCIUM 20 MG PO TABS: 20.0000 mg | ORAL_TABLET | Freq: Every day | ORAL | 3 refills | Status: AC

## 2017-06-07 MED ORDER — TRIAMCINOLONE ACETONIDE 55 MCG/ACT NA AERO: 2.0000 | INHALATION_SPRAY | Freq: Every day | NASAL | 12 refills | Status: DC

## 2017-06-07 MED ORDER — AMLODIPINE BESYLATE 5 MG PO TABS: 5.0000 mg | ORAL_TABLET | Freq: Every day | ORAL | 3 refills | Status: DC

## 2017-06-07 MED ORDER — LOSARTAN POTASSIUM-HCTZ 100-12.5 MG PO TABS: 1.0000 | ORAL_TABLET | Freq: Every day | ORAL | 3 refills | Status: DC

## 2017-06-07 NOTE — Progress Notes (Signed)
Jeremiah [name] 52 y.o.   Chief Complaint  Patient presents with  . Medication Refill    Xanax,BP/Cholesterol meds/Protonix    HISTORY OF PRESENT ILLNESS: This is a 52 y.o. male needs medication refills.  Also c/o 4 week h/o intermittent sinus congestion with occasional discharge.  HPI   Prior to Admission medications   Medication Sig Start Date End Date Taking? Authorizing Provider  ALPRAZolam [name]) 0.5 MG tablet TAKE 1 TABLET BY MOUTH ONCE DAILY AT BEDTIME AS NEEDED FOR ANXIETY 03/21/17  Yes [name], PA-C  amLODipine (NORVASC) 5 MG tablet Take 1 tablet (5 mg total) by mouth daily. 12/23/16  Yes [name], PA-C  atorvastatin (LIPITOR) 20 MG tablet TAKE 1 TABLET BY MOUTH ONCE DAILY 03/17/17  Yes Jeffery, Chelle, PA-C  famotidine (PEPCID) 20 MG tablet Take 1 tablet (20 mg total) by mouth daily as needed for heartburn or indigestion. 12/23/16  Yes [name], PA-C  ibuprofen (ADVIL,MOTRIN) 600 MG tablet Take 1 every 8 hours as needed for back pain. 12/23/16  Yes [name], PA-C  losartan-hydrochlorothiazide (HYZAAR) 100-12.5 MG tablet Take 1 tablet by mouth daily. 12/23/16  Yes [name], PA-C  nitroGLYCERIN (NITROSTAT) 0.4 MG SL tablet Place 1 tablet (0.4 mg total) under the tongue every 5 (five) minutes as needed for chest pain. 01/20/14  Yes Rosita Fire, Brittainy M, PA-C  pantoprazole (PROTONIX) 40 MG tablet Take 1 tablet (40 mg total) by mouth daily. 01/03/17  Yes Wendie Agreste, MD  predniSONE (DELTASONE) 20 MG tablet Take 2 tablets (40 mg total) by mouth daily with breakfast. 12/23/16  Yes [name], PA-C  valACYclovir (VALTREX) 1000 MG tablet One by mouth twice a day for 3 days 03/06/15  Yes [name], [name], MD  sucralfate (CARAFATE) 1 GM/10ML suspension Take 10 mLs (1 g total) by mouth 4 (four) times daily -  with meals and at bedtime. 03/13/15 03/20/15  [name], MD    No Known Allergies  Patient Active Problem List   Diagnosis Date Noted  . Loss of weight  01/26/2016  . Dysphagia 01/26/2016  . ESR raised 01/26/2016  . Rotator cuff tear, right 07/09/2013  . Essential hypertension, benign   . Depression   . Reflux   . Hyperlipidemia   . Alcohol dependence ([place])     Past Medical History:  Diagnosis Date  . Alcohol dependence ([place])   . Anxiety   . Depression   . Essential hypertension, benign   . History of heavy alcohol consumption   . HSV-2 (herpes simplex virus 2) infection   . Hyperglycemia   . Hyperlipidemia   . Prostatitis   . Reflux     Past Surgical History:  Procedure Laterality Date  . CARDIAC CATHETERIZATION  2006  . CARDIOVASCULAR STRESS TEST  07/2011  . CHOLECYSTECTOMY  1990  . DOPPLER ECHOCARDIOGRAPHY  11/2005  . LEFT HEART CATHETERIZATION WITH CORONARY ANGIOGRAM N/A 02/14/2014   Procedure: LEFT HEART CATHETERIZATION WITH CORONARY ANGIOGRAM;  Surgeon: [name], MD;  Location: [facility] CATH LAB;  Service: Cardiovascular;  Laterality: N/A;  . ORIF FINGER FRACTURE  2009   rt ring  . SHOULDER ARTHROSCOPY WITH SUBACROMIAL DECOMPRESSION, ROTATOR CUFF REPAIR AND BICEP TENDON REPAIR Right 07/09/2013   Procedure: RIGHT SHOULDER ARTHROSCOPY WITH SUBACROMIAL DECOMPRESSION, DEBRIDEMENT,  SUBSCAPULARIS TENDON REPAIR;  Surgeon: [name], MD;  Location: [place];  Service: Orthopedics;  Laterality: Right;  . [place]   left    Social History  Socioeconomic History  . Marital status: Single    Spouse name: Not on file  . Number of children: Not on file  . Years of education: Not on file  . Highest education level: Not on file  Social Needs  . Financial resource strain: Not on file  . Food insecurity - worry: Not on file  . Food insecurity - inability: Not on file  . Transportation needs - medical: Not on file  . Transportation needs - non-medical: Not on file  Occupational History  . Not on file  Tobacco Use  . Smoking status: Former Smoker    Types: Cigarettes    Last attempt  to quit: 07/02/1992    Years since quitting: 24.9  . Smokeless tobacco: Never Used  . Tobacco comment: quit 10-15 yrs ago  Substance and Sexual Activity  . Alcohol use: Yes    Alcohol/week: 0.0 oz    Comment: 6-12 pack nightly-6-7 beers,occ vodka,drinks every night  . Drug use: No  . Sexual activity: Yes  Other Topics Concern  . Not on file  Social History Narrative  . Not on file    Family History  Problem Relation Age of Onset  . Cancer Mother        breast  . Hyperlipidemia Mother   . Hypertension Mother   . Hyperlipidemia Father   . Hypertension Father   . Heart disease Father   . Hyperlipidemia Sister   . Hypertension Sister   . Stroke Maternal Grandmother   . Heart attack Maternal Grandmother   . Heart attack Maternal Grandfather   . Hyperlipidemia Sister   . Hypertension Sister      Review of Systems  Constitutional: Negative.  Negative for chills and fever.  HENT: Positive for congestion and sinus pain.   Eyes: Negative.  Negative for discharge and redness.  Respiratory: Negative.  Negative for cough and shortness of breath.   Cardiovascular: Negative.  Negative for chest pain and palpitations.  Gastrointestinal: Negative.  Negative for abdominal pain, diarrhea, nausea and vomiting.  Genitourinary: Negative.  Negative for dysuria, flank pain and hematuria.  Musculoskeletal: Negative.   Skin: Negative.  Negative for rash.  Neurological: Negative.  Negative for dizziness and headaches.  Endo/Heme/Allergies: Negative.   All other systems reviewed and are negative.  Vitals:   06/07/17 1111 06/07/17 1154  BP: (!) 155/111 122/80  Pulse: 86 84  Resp: 18   Temp: 98.3 F (36.8 C)   SpO2: 96%      Physical Exam  Constitutional: He is oriented to person, place, and time. He appears well-developed and well-nourished.  HENT:  Head: Normocephalic and atraumatic.  Eyes: Conjunctivae and EOM are normal. Pupils are equal, round, and reactive to light.  Neck:  Normal range of motion. Neck supple. No JVD present. No thyromegaly present.  Cardiovascular: Normal rate, regular rhythm and normal heart sounds.  Pulmonary/Chest: Effort normal and breath sounds normal.  Abdominal: Soft. Bowel sounds are normal. He exhibits no distension. There is no tenderness.  Musculoskeletal: Normal range of motion.  Lymphadenopathy:    He has no cervical adenopathy.  Neurological: He is alert and oriented to person, place, and time. No sensory deficit. He exhibits normal muscle tone. Coordination normal.  Skin: Skin is warm and dry. Capillary refill takes less than 2 seconds. No rash noted.  Psychiatric: He has a normal mood and affect. His behavior is normal.  Vitals reviewed.  A total of 25 minutes was spent in the room with the  patient, greater than 50% of which was in counseling/coordination of care.   ASSESSMENT & PLAN: Claude was seen today for medication refill.  Diagnoses and all orders for this visit:  Essential hypertension -     losartan-hydrochlorothiazide (HYZAAR) 100-12.5 MG tablet; Take 1 tablet by mouth daily. -     amLODipine (NORVASC) 5 MG tablet; Take 1 tablet (5 mg total) by mouth daily.  Mixed hyperlipidemia -     atorvastatin (LIPITOR) 20 MG tablet; Take 1 tablet (20 mg total) by mouth daily.  Gastroesophageal reflux disease, esophagitis presence not specified -     pantoprazole (PROTONIX) 40 MG tablet; Take 1 tablet (40 mg total) by mouth daily.  Sinus congestion -     azithromycin (ZITHROMAX) 250 MG tablet; Sig as indicated -     triamcinolone (NASACORT) 55 MCG/ACT AERO nasal inhaler; Place 2 sprays into the nose daily.  Other orders -     Cancel: Comprehensive metabolic panel -     Cancel: Lipid panel -     Cancel: Microalbumin, urine    Patient Instructions       IF you received an x-ray today, you will receive an invoice from [facility]. Please contact [facility] at [phone] with questions or  concerns regarding your invoice.   IF you received labwork today, you will receive an invoice from [facility]. Please contact [facility] at [phone] with questions or concerns regarding your invoice.   Our billing staff will not be able to assist you with questions regarding bills from these companies.  You will be contacted with the lab results as soon as they are available. The fastest way to get your results is to activate your My Chart account. Instructions are located on the last page of this paperwork. If you have not heard from [place] regarding the results in 2 weeks, please contact this office.      Hypertension Hypertension, commonly called high blood pressure, is when the force of blood pumping through the arteries is too strong. The arteries are the blood vessels that carry blood from the heart throughout the body. Hypertension forces the heart to work harder to pump blood and may cause arteries to become narrow or stiff. Having untreated or uncontrolled hypertension can cause heart attacks, strokes, kidney disease, and other problems. A blood pressure reading consists of a higher number over a lower number. Ideally, your blood pressure should be below 120/80. The first ("top") number is called the systolic pressure. It is a measure of the pressure in your arteries as your heart beats. The second ("bottom") number is called the diastolic pressure. It is a measure of the pressure in your arteries as the heart relaxes. What are the causes? The cause of this condition is not known. What increases the risk? Some risk factors for high blood pressure are under your control. Others are not. Factors you can change  Smoking.  Having type 2 diabetes mellitus, high cholesterol, or both.  Not getting enough exercise or physical activity.  Being overweight.  Having too much fat, sugar, calories, or salt (sodium) in your diet.  Drinking too much alcohol. Factors that are difficult or  impossible to change  Having chronic kidney disease.  Having a family history of high blood pressure.  Age. Risk increases with age.  Race. You may be at higher risk if you are African-American.  Gender. Men are at higher risk than women before age 40. After age 40, women are at higher risk than men.  Having obstructive sleep apnea.  Stress. What are the signs or symptoms? Extremely high blood pressure (hypertensive crisis) may cause:  Headache.  Anxiety.  Shortness of breath.  Nosebleed.  Nausea and vomiting.  Severe chest pain.  Jerky movements you cannot control (seizures).  How is this diagnosed? This condition is diagnosed by measuring your blood pressure while you are seated, with your arm resting on a surface. The cuff of the blood pressure monitor will be placed directly against the skin of your upper arm at the level of your heart. It should be measured at least twice using the same arm. Certain conditions can cause a difference in blood pressure between your right and left arms. Certain factors can cause blood pressure readings to be lower or higher than normal (elevated) for a short period of time:  When your blood pressure is higher when you are in a health care provider's office than when you are at home, this is called white coat hypertension. Most people with this condition do not need medicines.  When your blood pressure is higher at home than when you are in a health care provider's office, this is called masked hypertension. Most people with this condition may need medicines to control blood pressure.  If you have a high blood pressure reading during one visit or you have normal blood pressure with other risk factors:  You may be asked to return on a different day to have your blood pressure checked again.  You may be asked to monitor your blood pressure at home for 1 week or longer.  If you are diagnosed with hypertension, you may have other blood or  imaging tests to help your health care provider understand your overall risk for other conditions. How is this treated? This condition is treated by making healthy lifestyle changes, such as eating healthy foods, exercising more, and reducing your alcohol intake. Your health care provider may prescribe medicine if lifestyle changes are not enough to get your blood pressure under control, and if:  Your systolic blood pressure is above 130.  Your diastolic blood pressure is above 80.  Your personal target blood pressure may vary depending on your medical conditions, your age, and other factors. Follow these instructions at home: Eating and drinking  Eat a diet that is high in fiber and potassium, and low in sodium, added sugar, and fat. An example eating plan is called the DASH (Dietary Approaches to Stop Hypertension) diet. To eat this way: ? Eat plenty of fresh fruits and vegetables. Try to fill half of your plate at each meal with fruits and vegetables. ? Eat whole grains, such as whole wheat pasta, brown rice, or whole grain bread. Fill about one quarter of your plate with whole grains. ? Eat or drink low-fat dairy products, such as skim milk or low-fat yogurt. ? Avoid fatty cuts of meat, processed or cured meats, and poultry with skin. Fill about one quarter of your plate with lean proteins, such as fish, chicken without skin, beans, eggs, and tofu. ? Avoid premade and processed foods. These tend to be higher in sodium, added sugar, and fat.  Reduce your daily sodium intake. Most people with hypertension should eat less than 1,500 mg of sodium a day.  Limit alcohol intake to no more than 1 drink a day for nonpregnant women and 2 drinks a day for men. One drink equals 12 oz of beer, 5 oz of wine, or 1 oz of hard liquor. Lifestyle  Work  with your health care provider to maintain a healthy body weight or to lose weight. Ask what an ideal weight is for you.  Get at least 30 minutes of  exercise that causes your heart to beat faster (aerobic exercise) most days of the week. Activities may include walking, swimming, or biking.  Include exercise to strengthen your muscles (resistance exercise), such as pilates or lifting weights, as part of your weekly exercise routine. Try to do these types of exercises for 30 minutes at least 3 days a week.  Do not use any products that contain nicotine or tobacco, such as cigarettes and e-cigarettes. If you need help quitting, ask your health care provider.  Monitor your blood pressure at home as told by your health care provider.  Keep all follow-up visits as told by your health care provider. This is important. Medicines  Take over-the-counter and prescription medicines only as told by your health care provider. Follow directions carefully. Blood pressure medicines must be taken as prescribed.  Do not skip doses of blood pressure medicine. Doing this puts you at risk for problems and can make the medicine less effective.  Ask your health care provider about side effects or reactions to medicines that you should watch for. Contact a health care provider if:  You think you are having a reaction to a medicine you are taking.  You have headaches that keep coming back (recurring).  You feel dizzy.  You have swelling in your ankles.  You have trouble with your vision. Get help right away if:  You develop a severe headache or confusion.  You have unusual weakness or numbness.  You feel faint.  You have severe pain in your chest or abdomen.  You vomit repeatedly.  You have trouble breathing. Summary  Hypertension is when the force of blood pumping through your arteries is too strong. If this condition is not controlled, it may put you at risk for serious complications.  Your personal target blood pressure may vary depending on your medical conditions, your age, and other factors. For most people, a normal blood pressure is  less than 120/80.  Hypertension is treated with lifestyle changes, medicines, or a combination of both. Lifestyle changes include weight loss, eating a healthy, low-sodium diet, exercising more, and limiting alcohol. This information is not intended to replace advice given to you by your health care provider. Make sure you discuss any questions you have with your health care provider. Document Released: 05/16/2005 Document Revised: 04/13/2016 Document Reviewed: 04/13/2016 Elsevier Interactive Patient Education  2018 [employer]      Agustina Caroli, MD [facility]

## 2017-06-07 NOTE — Patient Instructions (Addendum)
   IF you received an x-ray today, you will receive an invoice from [facility]. Please contact [facility] at [phone] with questions or concerns regarding your invoice.   IF you received labwork today, you will receive an invoice from [facility]. Please contact [facility] at [phone] with questions or concerns regarding your invoice.   Our billing staff will not be able to assist you with questions regarding bills from these companies.  You will be contacted with the lab results as soon as they are available. The fastest way to get your results is to activate your My Chart account. Instructions are located on the last page of this paperwork. If you have not heard from us regarding the results in 2 weeks, please contact this office.     Hypertension Hypertension, commonly called high blood pressure, is when the force of blood pumping through the arteries is too strong. The arteries are the blood vessels that carry blood from the heart throughout the body. Hypertension forces the heart to work harder to pump blood and may cause arteries to become narrow or stiff. Having untreated or uncontrolled hypertension can cause heart attacks, strokes, kidney disease, and other problems. A blood pressure reading consists of a higher number over a lower number. Ideally, your blood pressure should be below 120/80. The first ("top") number is called the systolic pressure. It is a measure of the pressure in your arteries as your heart beats. The second ("bottom") number is called the diastolic pressure. It is a measure of the pressure in your arteries as the heart relaxes. What are the causes? The cause of this condition is not known. What increases the risk? Some risk factors for high blood pressure are under your control. Others are not. Factors you can change  Smoking.  Having type 2 diabetes mellitus, high cholesterol, or both.  Not getting enough exercise or physical  activity.  Being overweight.  Having too much fat, sugar, calories, or salt (sodium) in your diet.  Drinking too much alcohol. Factors that are difficult or impossible to change  Having chronic kidney disease.  Having a family history of high blood pressure.  Age. Risk increases with age.  Race. You may be at higher risk if you are African-American.  Gender. Men are at higher risk than women before age 45. After age 65, women are at higher risk than men.  Having obstructive sleep apnea.  Stress. What are the signs or symptoms? Extremely high blood pressure (hypertensive crisis) may cause:  Headache.  Anxiety.  Shortness of breath.  Nosebleed.  Nausea and vomiting.  Severe chest pain.  Jerky movements you cannot control (seizures).  How is this diagnosed? This condition is diagnosed by measuring your blood pressure while you are seated, with your arm resting on a surface. The cuff of the blood pressure monitor will be placed directly against the skin of your upper arm at the level of your heart. It should be measured at least twice using the same arm. Certain conditions can cause a difference in blood pressure between your right and left arms. Certain factors can cause blood pressure readings to be lower or higher than normal (elevated) for a short period of time:  When your blood pressure is higher when you are in a health care provider's office than when you are at home, this is called white coat hypertension. Most people with this condition do not need medicines.  When your blood pressure is higher at home than when you   are in a health care provider's office, this is called masked hypertension. Most people with this condition may need medicines to control blood pressure.  If you have a high blood pressure reading during one visit or you have normal blood pressure with other risk factors:  You may be asked to return on a different day to have your blood pressure  checked again.  You may be asked to monitor your blood pressure at home for 1 week or longer.  If you are diagnosed with hypertension, you may have other blood or imaging tests to help your health care provider understand your overall risk for other conditions. How is this treated? This condition is treated by making healthy lifestyle changes, such as eating healthy foods, exercising more, and reducing your alcohol intake. Your health care provider may prescribe medicine if lifestyle changes are not enough to get your blood pressure under control, and if:  Your systolic blood pressure is above 130.  Your diastolic blood pressure is above 80.  Your personal target blood pressure may vary depending on your medical conditions, your age, and other factors. Follow these instructions at home: Eating and drinking  Eat a diet that is high in fiber and potassium, and low in sodium, added sugar, and fat. An example eating plan is called the DASH (Dietary Approaches to Stop Hypertension) diet. To eat this way: ? Eat plenty of fresh fruits and vegetables. Try to fill half of your plate at each meal with fruits and vegetables. ? Eat whole grains, such as whole wheat pasta, brown rice, or whole grain bread. Fill about one quarter of your plate with whole grains. ? Eat or drink low-fat dairy products, such as skim milk or low-fat yogurt. ? Avoid fatty cuts of meat, processed or cured meats, and poultry with skin. Fill about one quarter of your plate with lean proteins, such as fish, chicken without skin, beans, eggs, and tofu. ? Avoid premade and processed foods. These tend to be higher in sodium, added sugar, and fat.  Reduce your daily sodium intake. Most people with hypertension should eat less than 1,500 mg of sodium a day.  Limit alcohol intake to no more than 1 drink a day for nonpregnant women and 2 drinks a day for men. One drink equals 12 oz of beer, 5 oz of wine, or 1 oz of hard  liquor. Lifestyle  Work with your health care provider to maintain a healthy body weight or to lose weight. Ask what an ideal weight is for you.  Get at least 30 minutes of exercise that causes your heart to beat faster (aerobic exercise) most days of the week. Activities may include walking, swimming, or biking.  Include exercise to strengthen your muscles (resistance exercise), such as pilates or lifting weights, as part of your weekly exercise routine. Try to do these types of exercises for 30 minutes at least 3 days a week.  Do not use any products that contain nicotine or tobacco, such as cigarettes and e-cigarettes. If you need help quitting, ask your health care provider.  Monitor your blood pressure at home as told by your health care provider.  Keep all follow-up visits as told by your health care provider. This is important. Medicines  Take over-the-counter and prescription medicines only as told by your health care provider. Follow directions carefully. Blood pressure medicines must be taken as prescribed.  Do not skip doses of blood pressure medicine. Doing this puts you at risk for problems and   can make the medicine less effective.  Ask your health care provider about side effects or reactions to medicines that you should watch for. Contact a health care provider if:  You think you are having a reaction to a medicine you are taking.  You have headaches that keep coming back (recurring).  You feel dizzy.  You have swelling in your ankles.  You have trouble with your vision. Get help right away if:  You develop a severe headache or confusion.  You have unusual weakness or numbness.  You feel faint.  You have severe pain in your chest or abdomen.  You vomit repeatedly.  You have trouble breathing. Summary  Hypertension is when the force of blood pumping through your arteries is too strong. If this condition is not controlled, it may put you at risk for serious  complications.  Your personal target blood pressure may vary depending on your medical conditions, your age, and other factors. For most people, a normal blood pressure is less than 120/80.  Hypertension is treated with lifestyle changes, medicines, or a combination of both. Lifestyle changes include weight loss, eating a healthy, low-sodium diet, exercising more, and limiting alcohol. This information is not intended to replace advice given to you by your health care provider. Make sure you discuss any questions you have with your health care provider. Document Released: 05/16/2005 Document Revised: 04/13/2016 Document Reviewed: 04/13/2016 Elsevier Interactive Patient Education  2018 Elsevier Inc.  

## 2017-06-10 ENCOUNTER — Encounter: Payer: Self-pay | Admitting: Emergency Medicine

## 2017-06-10 MED ORDER — ALPRAZOLAM 0.5 MG PO TABS: ORAL_TABLET | ORAL | 0 refills | Status: DC

## 2017-06-10 NOTE — Addendum Note (Signed)
Addended by: Wallis BambergMANI, Hattie Aguinaldo on: 06/10/2017 03:46 PM   Modules accepted: Orders

## 2017-06-10 NOTE — Progress Notes (Signed)
Refilled Xanax. 

## 2017-07-19 ENCOUNTER — Other Ambulatory Visit: Payer: Self-pay | Admitting: Urgent Care

## 2017-07-19 NOTE — Telephone Encounter (Signed)
Refill of Xanax  LOV 06/07/17  LRF  06/10/17  #30  0 refills  [facility] [address] - [place], [place] - [id number]

## 2017-08-29 ENCOUNTER — Telehealth: Payer: Self-pay | Admitting: *Deleted

## 2017-08-29 NOTE — Telephone Encounter (Signed)
Prescription not picked up SHREDDED 

## 2017-11-24 ENCOUNTER — Other Ambulatory Visit: Payer: Self-pay | Admitting: Emergency Medicine

## 2017-11-24 DIAGNOSIS — K219 Gastro-esophageal reflux disease without esophagitis: Secondary | ICD-10-CM

## 2018-02-05 ENCOUNTER — Other Ambulatory Visit: Payer: Self-pay | Admitting: Urgent Care

## 2018-04-20 LAB — UNMAPPED LAB RESULTS
Basophil # (HT): 0 10 3/uL — NL (ref 0.0–0.2)
Basophil % (HT): 0.6 % — NL (ref 0.0–1.8)
Eosinophil # (HT): 0.1 10 3/uL — NL (ref 0.0–0.5)
Eosinophil % (HT): 1.2 % — NL (ref 0.0–7.0)
Hematocrit (HT): 40.8 % — NL (ref 40.0–52.0)
Hemoglobin (HGB) (HT): 14.6 g/dL — NL (ref 13.0–17.5)
Lymphocyte # (HT): 1.3 10 3/uL — NL (ref 0.9–3.8)
Lymphocyte % (HT): 26 % — NL (ref 17.0–44.0)
MCHC (HT): 35.8 g/dL — NL (ref 32.0–36.0)
MCV (HT): 88.9 FL — NL (ref 81.0–99.0)
Mean Corpuscular Hemoglobin (MCH) (HT): 31.8 pg — NL (ref 26.0–34.0)
Monocyte # (HT): 0.5 10 3/uL — NL (ref 0.2–1.0)
Monocyte % (HT): 9.1 % — NL (ref 4.0–12.0)
Neutrophil # (HT): 3.1 10 3/uL — NL (ref 1.5–7.7)
Platelets (HT): 218 10 3/uL — NL (ref 140–400)
RBC (HT): 4.59 10 6/uL — NL (ref 4.20–5.90)
RDW (HT): 12.6 % — NL (ref 11.5–15.0)
Seg Neut % (HT): 63.1 % — NL (ref 40.0–75.0)
WBC (HT): 5 10 3/uL — NL (ref 4.0–10.8)

## 2018-05-04 ENCOUNTER — Other Ambulatory Visit
Admission: RE | Admit: 2018-05-04 | Discharge: 2018-05-04 | Disposition: A | Discharge status: Home / Self Care | Payer: Commercial Managed Care - PPO | Source: Ambulatory Visit | Attending: Emergency Medicine | Admitting: Emergency Medicine

## 2018-05-04 DIAGNOSIS — E785 Hyperlipidemia, unspecified: Secondary | ICD-10-CM | POA: Insufficient documentation

## 2018-05-04 DIAGNOSIS — R945 Abnormal results of liver function studies: Secondary | ICD-10-CM | POA: Insufficient documentation

## 2018-05-04 DIAGNOSIS — R946 Abnormal results of thyroid function studies: Secondary | ICD-10-CM | POA: Insufficient documentation

## 2018-05-04 LAB — AMYLASE: Amylase: 124 U/L — ABNORMAL HIGH (ref 28–100)

## 2018-05-04 LAB — HEPATIC FUNCTION PANEL
ALT: 43 U/L (ref 0–50)
AST: 40 U/L (ref 0–50)
Albumin: 5.1 g/dL (ref 3.5–5.2)
Alk Phos: 71 U/L (ref 40–130)
Bilirubin,Direct: <0.2 mg/dL (ref 0.0–0.3)
Bilirubin,Total: 0.6 mg/dL (ref 0.0–1.2)
Total Protein: 7.2 g/dL (ref 6.3–7.7)

## 2018-05-04 LAB — BASIC METABOLIC PANEL
Anion Gap: 13 (ref 7–16)
CO2: 26 mmol/L (ref 20–28)
Calcium: 9.9 mg/dL (ref 8.6–10.2)
Chloride: 100 mmol/L (ref 96–108)
Creatinine: 0.87 mg/dL (ref 0.67–1.17)
GFR,Black: 113 *
GFR,Caucasian: 98 *
Glucose: 113 mg/dL — ABNORMAL HIGH (ref 60–99)
Lab: 21 mg/dL — ABNORMAL HIGH (ref 6–20)
Potassium: 4.4 mmol/L (ref 3.3–5.1)
Sodium: 139 mmol/L (ref 133–145)

## 2018-05-04 LAB — CBC
Hematocrit: 41 % (ref 40–51)
Hemoglobin: 14.3 g/dL (ref 13.7–17.5)
MCH: 32 pg/cell (ref 26–32)
MCHC: 35 g/dL (ref 32–37)
MCV: 92 fL (ref 79–92)
Platelets: 192 10*3/uL (ref 150–330)
RBC: 4.5 MIL/uL — ABNORMAL LOW (ref 4.6–6.1)
RDW: 11.9 % (ref 11.6–14.4)
WBC: 4.1 10*3/uL — ABNORMAL LOW (ref 4.2–9.1)

## 2018-05-04 LAB — URIC ACID: Urate: 5.1 mg/dL (ref 3.9–9.0)

## 2018-05-04 LAB — TSH: TSH: 2.3 u[IU]/mL (ref 0.27–4.20)

## 2018-05-04 LAB — LIPID PANEL
Chol/HDL Ratio: 2.8
Cholesterol: 163 mg/dL
HDL: 59 mg/dL (ref 40–60)
LDL Calculated: 90 mg/dL
Non HDL Cholesterol: 104 mg/dL
Triglycerides: 69 mg/dL

## 2018-05-04 LAB — LIPASE: Lipase: 45 U/L (ref 13–60)

## 2018-05-04 LAB — PSA (EFF.4-2010): PSA (eff. 4-2010): 2.07 ng/mL (ref 0.00–4.00)

## 2018-05-04 LAB — VITAMIN D: 25-OH Vit Total: 35 ng/mL (ref 30–60)

## 2018-05-04 LAB — HEMOGLOBIN A1C: Hemoglobin A1C: 5.6 %

## 2018-05-04 LAB — VITAMIN B12: Vitamin B12: 432 pg/mL (ref 232–1245)

## 2018-05-28 ENCOUNTER — Encounter: Payer: Self-pay | Admitting: Urology

## 2018-05-28 ENCOUNTER — Ambulatory Visit: Payer: Commercial Managed Care - PPO | Attending: Urology | Admitting: Urology

## 2018-05-28 VITALS — BP 132/90 | HR 95 | Ht 69.0 in | Wt 185.0 lb

## 2018-05-28 DIAGNOSIS — N419 Inflammatory disease of prostate, unspecified: Secondary | ICD-10-CM | POA: Insufficient documentation

## 2018-05-28 DIAGNOSIS — N529 Male erectile dysfunction, unspecified: Secondary | ICD-10-CM

## 2018-05-28 DIAGNOSIS — N400 Enlarged prostate without lower urinary tract symptoms: Secondary | ICD-10-CM

## 2018-05-28 LAB — POCT URINALYSIS DIPSTICK
Blood,UA POCT: NEGATIVE
Glucose,UA POCT: NORMAL mg/dL
Ketones,UA POCT: NEGATIVE mg/dL
Leuk Esterase,UA POCT: NEGATIVE
Lot #: 39009103
Nitrite,UA POCT: NEGATIVE
PH,UA POCT: 7 (ref 5–8)
Protein,UA POCT: NEGATIVE mg/dL
Specific gravity,UA POCT: 1.01 (ref 1.002–1.030)

## 2018-05-28 LAB — POCT BLADDER SCAN PVR

## 2018-05-28 MED ORDER — DOXYCYCLINE HYCLATE 100 MG PO TABS *I*
100.0000 mg | ORAL_TABLET | Freq: Two times a day (BID) | ORAL | 0 refills | Status: DC
Start: 2018-05-28 — End: 2018-06-15

## 2018-05-28 MED ORDER — IBUPROFEN 200 MG PO TABS *I*
600.0000 mg | ORAL_TABLET | Freq: Three times a day (TID) | ORAL | 1 refills | Status: AC | PRN
Start: 2018-05-28 — End: ?

## 2018-05-28 MED ORDER — TADALAFIL 5 MG PO TABS *I*
5.0000 mg | ORAL_TABLET | Freq: Every day | ORAL | 2 refills | Status: DC
Start: 2018-05-28 — End: 2018-07-09

## 2018-05-28 NOTE — Progress Notes (Signed)
CC   Prostatitis    HPI  Kyle Taylor is a 52 y.o. male seen in evaluation today for prostatitis.  Patient reports starting approximately 3 months ago he developed acute onset of sinus infection due to the significant amount traveling that [place] to [place] for [place].  At that time he went to the urgent care and was diagnosed with a upper respiratory infection and given a steroid injection along with antibiotics.  Soon thereafter he developed acute onset of right testicular pain, weakening stream, nocturia 1-2, postvoid dribbling, pain with ejaculation and dysuria.  He is also been suffering from erectile dysfunction during that time.  He states that he then went back to a urgent care facility who had the patient undergo a prostate examination and was found to have an enlarged prostate and placed on antibiotics 10 days but could not recall the name of the medication.  He reports no improvement in his symptoms after completing a course of 10 days of antibiotics.  Prior to this starting 3 months ago he did have weakening stream and some postvoid dribbling.      Meds  Current Outpatient Medications   Medication    amLODIPine (NORVASC) 10 MG tablet    atorvastatin (LIPITOR) 20 MG tablet    losartan-hydrochlorothiazide (HYZAAR) 100-12.5 MG per tablet    omeprazole (PRILOSEC) 20 MG capsule    ALPRAZolam (XANAX) 0.25 MG tablet    doxycycline hyclate (VIBRA-TABS) 100 MG tablet    tadalafil (CIALIS) 5 MG tablet    ibuprofen (ADVIL,MOTRIN) 200 MG tablet     No current facility-administered medications for this visit.        All  No Known Allergies (drug, envir, food or latex)    PMH  Past Medical History:   Diagnosis Date    Anxiety     GERD (gastroesophageal reflux disease)     Hypertension        PSH  Past Surgical History:   Procedure Laterality Date    CHOLECYSTECTOMY      COLONOSCOPY         FH  Family History   Problem Relation Age of Onset    Cancer Mother     GERD Mother     High  Blood Pressure Mother     GERD Father     Heart Disease Father     High Blood Pressure Father     GERD Sister     High Blood Pressure Sister     Heart Disease Maternal Grandmother        Social  Social History     Socioeconomic History    Marital status: Single     Spouse name: Not on file    Number of children: Not on file    Years of education: Not on file    Highest education level: Not on file   Occupational History    Not on file   Tobacco Use    Smoking status: Former Smoker     Types: Cigarettes    Smokeless tobacco: Never Used   Substance and Sexual Activity    Alcohol use: Yes    Drug use: Never    Sexual activity: Yes     Partners: Female     Birth control/protection: None   Social History Narrative    Not on file           See review of system note, reviewed with patient    PE  BP 132/90    Pulse 95    Ht 1.753 m (5\' 9" )    Wt 83.9 kg (185 lb)    BMI 27.32 kg/m   General: Alert and cooperative, nontoxic appearing  HEENT: Sclerae anicteric, mucous membranes moist, normocephalic atraumatic  Lungs: Unlabored breathing  CV: No edema  Abdomen: Soft nontender, nondistended, bladder not palpable, no palpable masses, nonrigid  Skin: warm and dry  Psych: Normal speech  GU: Testicles are descended bilaterally with no palpable masses or indurations of the testes.  Left varicocele cord was palpated but no obvious masses surrounding the right testicle.  Epididymis are palpated bilaterally with no tenderness.  Scrotum is without any skin breakdown or lesions.  DRE: Deferred by patient today, will plan next visit if no improvement.    Labs  Recent Results (from the past 24 hour(s))   POCT urinalysis dipstick    Collection Time: 05/28/18  2:18 PM   Result Value Ref Range    Specific gravity,UA POCT 1.010 1.002 - 1.030    PH,UA POCT 7.0 5 - 8    Leuk Esterase,UA POCT Negative Negative    Nitrite,UA POCT Negative Negative    Protein,UA POCT Negative Negative mg/dL    Glucose,UA POCT Normal Normal mg/dL     Ketones,UA POCT Negative Negative mg/dL    Urobilinogen,UA      Bilirubin,Ur      Blood,UA POCT Negative Negative    Exp date 03/30/19     Lot # [id number]    POCT Bladder Scan PVR    Collection Time: 05/28/18  2:33 PM   Result Value Ref Range    Residual mL 13ML          AUA SYMPTOM SCORE (AUASS)  During the past month...  How often have you had a sensation of not emptying your bladder completely after you finish urinating?: Less Than Half the Time  How often have you had to urinate again less than two hours after you have finished urinating?: About Half the Time  How often have you found you stopped and started again several times when you urinated?: Less Than Half the Time  How often have you found it difficult to postpone urination?: Less Than 1 Time In 5  How often have you had a weak urinary stream?: More Than Half the Time  How often have you had to push or strain to begin urination?: Less Than 1 Time In 5  How many times did you most typically get up each night to urinate from the time you went to bed until the time you got up in the morning?: 1 Time  AUA Symptom Total: 14  1-7 (Mild)  8-19 (Moderate)  20-35 (Severe)    QUALITY OF LIFE (QoL)  If you were to spend the rest of your life with your urinary condition just the way it is now, how would you feel about that?: Mixed    Lab Results   Component Value Date    PSAR3 2.07 05/04/2018       Imaging  None    A/P    1.  Acute prostatitis: Patient's clinical symptoms are consistent with acute prostatitis.  Patient deferred prostate examination today but given his clinical prodrome of symptoms, he clinically fits the diagnosis.  We'll start patient on doxycycline 100 mg twice a day for the next 3 weeks, Cialis 5 mg daily and ibuprofen 600 mg 3 times a day.  If the patient has no clinical improvement in  his symptoms in 3 weeks' time we'll consider alternative antibiotics or extended course of antibiotics.    2.  Left varicocele: Patient's examination is positive  for a left palpable cord of the left testicle consistent with varicocele.  The patient undergo testicular ultrasound to further evaluate this.    3.  BPH with lower urinary tract symptoms: Start Cialis 5 mg daily for acute prostatitis.  Patient will return in approximately 6 weeks' time for prostate examination but his PSA is relatively low at 2.07.    4.  Erectile dysfunction: I reviewed with the patient the normal physiologic mechanisms underlying the production and maintenance of a normal erection, the causes of erectile dysfunction, both organic and psychogenic, the prevalence of the disease in the population, as well as currently available treatment alternatives. These treatment options currently include: PDE-5 inhibitors (Viagra, Levitra, Cialis [daily and "as needed"], Stendra and Staxyn), MUSE, Intracavernosal Injection, VED, and IPP (inflatable penile prostheses). Precautions, risks, side effects and instructions of treatment modalities were discussed. Sexual counseling was also discussed.   In addition, we reviewed the clinical evaluation of ED, which can help pinpoint the cause of erectile dysfunction in many instances. However, we also reviewed the fact that, except in rare instances, there is little that can be done to reverse the causes that may be uncovered.     The patient at this point is content with moving ahead with treatment, he would like to proceed with cialis 5mg  daily.    Ancil Linsey, PA 05/28/2018 4:34 PM

## 2018-05-28 NOTE — Progress Notes (Signed)
REVIEW of SYSTEMS    Constitutional: Denied  Eyes: Denied  Allergy: Denied  Neurological: Denied  Endocrine: Denied  Gastrointestinal: ABD PAIN/INDIGESTION/HEARTBURN   Cardiovascular: HBP  Integumentary: Denied  Musculoskeletal: NECK/BACK PAIN   Ear/Nose/Throat/ Mouth: SINUS PROBLEMS   Genitourinary: PAINFUL URINATION   Respiratory: Denied  Hematologic/Lymphatic: Denied    Psychologic:  Satisfied: Yes  Depressed: No  Suicidal: No

## 2018-06-13 ENCOUNTER — Telehealth: Payer: Self-pay | Admitting: Urology

## 2018-06-13 NOTE — Telephone Encounter (Signed)
Patient scheduled for [place] at [facility] on 1/30 @ 1:00pm

## 2018-06-15 ENCOUNTER — Other Ambulatory Visit: Payer: Self-pay | Admitting: Urology

## 2018-06-15 MED ORDER — DOXYCYCLINE HYCLATE 100 MG PO TABS *I*
100.0000 mg | ORAL_TABLET | Freq: Two times a day (BID) | ORAL | 0 refills | Status: AC
Start: 2018-06-15 — End: 2018-07-06

## 2018-06-15 NOTE — Telephone Encounter (Signed)
Copied from CRM [id number]. Topic: Medications/Prescriptions - Refill Request  >> Jun 15, 2018 10:04 AM Clemence Stillings, D'Nisha wrote:  Gus Height calling to request prescription(s) doxycycline hyclate (VIBRA-TABS) 100 MG tablet to be sent to the following Pharmacy [facility] [address] [place] - [address]    Is patient out of the medication? yes      Patient can be reached if necessary at [phone].

## 2018-06-28 ENCOUNTER — Other Ambulatory Visit: Payer: Self-pay | Admitting: Gastroenterology

## 2018-07-09 ENCOUNTER — Encounter: Payer: Self-pay | Admitting: Urology

## 2018-07-09 ENCOUNTER — Ambulatory Visit: Payer: Commercial Managed Care - PPO | Attending: Urology | Admitting: Urology

## 2018-07-09 ENCOUNTER — Telehealth: Payer: Self-pay | Admitting: Urology

## 2018-07-09 VITALS — BP 139/95 | HR 95 | Ht 68.5 in | Wt 185.0 lb

## 2018-07-09 DIAGNOSIS — N400 Enlarged prostate without lower urinary tract symptoms: Secondary | ICD-10-CM

## 2018-07-09 DIAGNOSIS — N529 Male erectile dysfunction, unspecified: Secondary | ICD-10-CM

## 2018-07-09 LAB — POCT URINALYSIS DIPSTICK
Blood,UA POCT: NEGATIVE
Glucose,UA POCT: NORMAL mg/dL
Ketones,UA POCT: NEGATIVE mg/dL
Leuk Esterase,UA POCT: NEGATIVE
Lot #: 39009103
Nitrite,UA POCT: NEGATIVE
PH,UA POCT: 7 (ref 5–8)
Protein,UA POCT: NEGATIVE mg/dL
Specific gravity,UA POCT: 1.005 (ref 1.002–1.030)

## 2018-07-09 MED ORDER — TADALAFIL 5 MG PO TABS *I*
5.0000 mg | ORAL_TABLET | Freq: Every day | ORAL | 11 refills | Status: DC
Start: 2018-07-09 — End: 2019-07-09

## 2018-07-09 NOTE — Telephone Encounter (Signed)
Copied from CRM [id number]. Topic: Medications/Prescriptions - Medication Question/Problem  >> Jul 09, 2018 11:04 AM Kemiya Batdorf, D'Nisha wrote:  Gus Height is calling to advise Viviann Spare PA that he checked his antibiotic when he got home and states he only has a couple left. States they spoke about something stronger being sent in.    Patient can be reached at [phone]

## 2018-07-09 NOTE — Progress Notes (Signed)
ATIENT NAME: Kyle Taylor  CONSULT DATE: 07/09/2018  DOB: 10/19/1965  MED. REC. NO: [id number]    REFERRING M.D.:  Rhea PinkKoul, Suneel Mohan, MD       CONSULTING M.D.: Danny LawlessJoy Michaelides MD    _____________________________________________________________________________    CHIEF COMPLAINT: Kyle Taylor is a 53 y.o. male known to have prostatitis, ED, BPH with lower urinary tract symtpoms and left testicular mass present for 6 week medication check and discussion.    SUBJECTIVE: Patient reports improvement in pain/discomfort perirectum still continues despite 3 weeks of doxycycline. ED has improved with cialis 5mg  daily along with LUTS. No dysuria, hematuria otherwise.    Review of Systems  Denies any fever or chills  Denies any nausea or vomiting  Denies any flank pain or abdominal pain  Denies any constipation    Past Medical Medical History  Past Medical History:   Diagnosis Date    Anxiety     GERD (gastroesophageal reflux disease)     Hypertension         Allergies  No Known Allergies (drug, envir, food or latex)    Current Medications  Current Outpatient Medications   Medication    tadalafil (CIALIS) 5 MG tablet    amLODIPine (NORVASC) 10 MG tablet    atorvastatin (LIPITOR) 20 MG tablet    losartan-hydrochlorothiazide (HYZAAR) 100-12.5 MG per tablet    omeprazole (PRILOSEC) 20 MG capsule    ALPRAZolam (XANAX) 0.25 MG tablet    ibuprofen (ADVIL,MOTRIN) 200 MG tablet     No current facility-administered medications for this visit.         OBJECTIVE: BP (!) 139/95 (BP Location: Right arm, Patient Position: Sitting)    Pulse 95    Ht 1.74 m (5' 8.5")    Wt 83.9 kg (185 lb)    BMI 27.72 kg/m .  General: Alert and cooperative  Eyes: sclera white, anicteric  HENT: mucous membrane moist  Lungs: Unlabored breathing  CV: No lower extremity edema  DRE: Deferred until next office visit.    IMAGING/LABORATORY DATA:   Recent Results (from the past 24 hour(s))   POCT urinalysis dipstick    Collection Time: 07/09/18  9:54  AM   Result Value Ref Range    Specific gravity,UA POCT 1.005 1.002 - 1.030    PH,UA POCT 7.0 5 - 8    Leuk Esterase,UA POCT Negative Negative    Nitrite,UA POCT Negative Negative    Protein,UA POCT Negative Negative mg/dL    Glucose,UA POCT Normal Normal mg/dL    Ketones,UA POCT Negative Negative mg/dL    Urobilinogen,UA      Bilirubin,Ur      Blood,UA POCT Negative Negative    Exp date 03/30/19     Lot # [id number]            ASSESSMENT: 53 y.o. male known to have prostatitis, ED, BPH with lower urinary tract symtpoms and left testicular mass    PLAN:     1.  Acute prostatitis: Continue repeat 6 week course of doxycycline. Pt will call with update and consider placing him on bactrim. Follow up in 6 weeks for DRE and post-prostatic massage cultures if no improvement.    2.  Left varicocele: Reviewed pt ultrasound. No acute surgical interventions indicated.    3.  BPH with lower urinary tract symptoms:     4.  Erectile dysfunction: Continue cilais 5mg  daily      Ancil LinseySteven M Kane Kusek, PA

## 2018-07-09 NOTE — Telephone Encounter (Signed)
Bactrim sent to patients pharmacy

## 2018-07-09 NOTE — Telephone Encounter (Signed)
Called patient to notify he has a prescription at the Pharmacy from [facility]

## 2018-07-09 NOTE — Progress Notes (Signed)
REVIEW of SYSTEMS     Constitutional: Denied  Eyes: Denied  Allergy: Denied  Neurological: Denied  Endocrine: Denied  Gastrointestinal: INDIGESTION/ HEARTBURN  Cardiovascular: HIGH BLOOD PRESSURE  Integumentary: Denied  Musculoskeletal: Denied  Ear/Nose/Throat/ Mouth: Denied  Genitourinary: Denied  Respiratory: Denied  Hematologic/Lymphatic: Denied    Psychologic:  Satisfied: Yes  Depressed: No  Suicidal: No

## 2018-07-12 ENCOUNTER — Telehealth: Payer: Self-pay | Admitting: Urology

## 2018-07-12 MED ORDER — SULFAMETHOXAZOLE-TRIMETHOPRIM 800-160 MG PO TABS *I*
1.0000 | ORAL_TABLET | Freq: Two times a day (BID) | ORAL | 0 refills | Status: AC
Start: 2018-07-12 — End: 2018-08-11

## 2018-07-12 NOTE — Telephone Encounter (Signed)
Writer called and left generic message for patient, per FYI, when he calls back please let him know about antibiotic.

## 2018-07-12 NOTE — Telephone Encounter (Signed)
Copied from CRM [id number]. Topic: Medications/Prescriptions - Medication Question/Problem  >> Jul 12, 2018 11:21 AM Princess Perna A wrote:  Kyle Taylor is calling he went to the [facility] [address], [place] - [address] [phone] (Phone) [phone] (Fax). He was informed by the pharmacy they do not have a prescription for Bactrim. Please contact patient at [phone].

## 2018-07-12 NOTE — Telephone Encounter (Signed)
Resent medication. THanks!

## 2018-07-12 NOTE — Telephone Encounter (Signed)
Brett Canales,   I don't see that a prescription for bactrim was sent to the pharmacy for this patient.   In the encounter from 07/09/2018 you said you sent it over.   Thank you, Shanda Bumps

## 2018-07-13 NOTE — Telephone Encounter (Signed)
Spoke with patient, he is aware of antibiotic.

## 2018-08-14 ENCOUNTER — Telehealth: Payer: Self-pay | Admitting: Urology

## 2018-08-14 NOTE — Telephone Encounter (Signed)
Spoke to patient about 3/23 appointment that has been cancelled. Patient is aware of the cancellation and will wait to hear from our office to reschedule.

## 2018-08-20 ENCOUNTER — Ambulatory Visit: Payer: Commercial Managed Care - PPO | Attending: Urology | Admitting: Urology

## 2018-08-20 DIAGNOSIS — N419 Inflammatory disease of prostate, unspecified: Secondary | ICD-10-CM

## 2018-08-20 MED ORDER — OMEPRAZOLE 20 MG PO CPDR *I*
20.0000 mg | DELAYED_RELEASE_CAPSULE | Freq: Every day | ORAL | 1 refills | Status: AC
Start: 2018-08-20 — End: 2018-09-19

## 2018-08-20 MED ORDER — SULFAMETHOXAZOLE-TRIMETHOPRIM 800-160 MG PO TABS *I*
1.0000 | ORAL_TABLET | Freq: Two times a day (BID) | ORAL | 0 refills | Status: DC
Start: 2018-08-20 — End: 2018-08-23

## 2018-08-20 NOTE — Progress Notes (Signed)
Telephone Visit     This is an established patient visit.    Reason for visit: Prostatitis, erectile dysfunction    HPI:  53 year old male with a past medical history significant for prostatitis and erectile dysfunction presents via telephone encounter to discuss his most recent symptoms.  Patient states after completing the course of Bactrim his lower urinary tract symptoms have improved significantly.  He states he continues to take Cialis 5 mg daily with good effect and has no other new complaints.  Patient is requesting a refill of his Prilosec and Valium as he is unable to reach his primary care provider.  I offered to refill the patient's Prilosec but stated that for controlled substances he should recheck to his primary care provider.    Patient's problem list, allergies, and medications were reviewed and updated as appropriate. Please see the EHR for full details.    Assessment Plan:  Prostatitis: Patient's symptoms have resolved after completion of the Bactrim course.  Patient does report some mild symptoms but states that overall he is feels much improved.  The patient was sent a new prescription for Bactrim in the event that his symptoms worsen.  Follow-up in 6 months.    The plan was discussed with the patient and the patient/patient rep demonstrated understanding to the provider's satisfaction.    Consent was previously obtained from the patient to complete this telephone consult; including the potential for financial liability.    5-10 minutes was spent reviewing the EMR and management of this patient.     Ancil Linsey, PA at 9:46 AM on 08/20/2018

## 2018-08-23 ENCOUNTER — Telehealth: Payer: Self-pay | Admitting: Urology

## 2018-08-23 MED ORDER — LEVOFLOXACIN 500 MG PO TABS *I*
500.0000 mg | ORAL_TABLET | Freq: Every day | ORAL | 0 refills | Status: AC
Start: 2018-08-23 — End: 2018-09-06

## 2018-08-23 NOTE — Telephone Encounter (Signed)
Copied from CRM [id number]. Topic: Medications/Prescriptions - Medication Question/Problem  >> Aug 23, 2018  9:31 AM Thayer Ohm wrote:  Eather Colas from [facility] pharmacy is calling in regards to the medication bactrim. She states the PCP is prescribing a medication that is causing an interaction. She can be reached at [phone]

## 2018-08-23 NOTE — Telephone Encounter (Signed)
Leafy Kindle and spoke to the pharmacy.   Reporting that the PCP wrote a script for Hydrochlorothiazide and losartan and it interacts with the Bactrim.   States that a prescription for Cipro would be okay.   Did you want to change to the medication?  Thank you, Shanda Bumps

## 2018-08-23 NOTE — Telephone Encounter (Signed)
Levaquin has been sent to his pharmacy instead,    Thank you!  Brett Canales

## 2018-09-14 ENCOUNTER — Telehealth: Payer: Self-pay | Admitting: Urology

## 2018-09-14 NOTE — Telephone Encounter (Signed)
-----   Message from Ancil Linsey, [place] sent at 09/14/2018 10:02 AM EDT -----  Regarding: FW: MEDICATION  Would you mind calling him? Thank you!  ----- Message -----  From: [facility] Nurse  Sent: 09/14/2018   9:41 AM EDT  To: Ancil Linsey, PA  Subject: MEDICATION                                       Good Morning Brett Canales,    Patient was wondering about his medication you prescribed him.     Can you call him please?    Hope all is well with you and your family. We miss you here :)      Stay Safe

## 2018-09-14 NOTE — Telephone Encounter (Signed)
Called and relayed message to the patient.

## 2018-09-14 NOTE — Telephone Encounter (Signed)
Spoke with patient did not pick up levaquin continued Bactrim no interactions. Patient states no symptoms at present. Please advise.

## 2018-09-14 NOTE — Telephone Encounter (Signed)
As patient is not having any symptoms, okay to stop Bactrim and hold off on initiating Levaquin.

## 2019-01-03 ENCOUNTER — Encounter: Payer: Self-pay | Admitting: Emergency Medicine

## 2019-01-03 ENCOUNTER — Other Ambulatory Visit: Payer: Self-pay

## 2019-01-03 ENCOUNTER — Ambulatory Visit (INDEPENDENT_AMBULATORY_CARE_PROVIDER_SITE_OTHER): Payer: 59 | Admitting: Emergency Medicine

## 2019-01-03 VITALS — BP 129/79 | HR 80 | Temp 99.1°F | Resp 16 | Ht 68.0 in | Wt 188.8 lb

## 2019-01-03 DIAGNOSIS — T887XXA Unspecified adverse effect of drug or medicament, initial encounter: Secondary | ICD-10-CM | POA: Diagnosis not present

## 2019-01-03 DIAGNOSIS — M25471 Effusion, right ankle: Secondary | ICD-10-CM

## 2019-01-03 DIAGNOSIS — Z23 Encounter for immunization: Secondary | ICD-10-CM | POA: Diagnosis not present

## 2019-01-03 DIAGNOSIS — M25472 Effusion, left ankle: Secondary | ICD-10-CM

## 2019-01-03 NOTE — Patient Instructions (Addendum)
Decrease amlodipine dose to 5 mg a day.  Continue losartan/HCTZ.  Monitor blood pressure at home daily. Hypertension, Adult High blood pressure (hypertension) is when the force of blood pumping through the arteries is too strong. The arteries are the blood vessels that carry blood from the heart throughout the body. Hypertension forces the heart to work harder to pump blood and may cause arteries to become narrow or stiff. Untreated or uncontrolled hypertension can cause a heart attack, heart failure, a stroke, kidney disease, and other problems. A blood pressure reading consists of a higher number over a lower number. Ideally, your blood pressure should be below 120/80. The first ("top") number is called the systolic pressure. It is a measure of the pressure in your arteries as your heart beats. The second ("bottom") number is called the diastolic pressure. It is a measure of the pressure in your arteries as the heart relaxes. What are the causes? The exact cause of this condition is not known. There are some conditions that result in or are related to high blood pressure. What increases the risk? Some risk factors for high blood pressure are under your control. The following factors may make you more likely to develop this condition:  Smoking.  Having type 2 diabetes mellitus, high cholesterol, or both.  Not getting enough exercise or physical activity.  Being overweight.  Having too much fat, sugar, calories, or salt (sodium) in your diet.  Drinking too much alcohol. Some risk factors for high blood pressure may be difficult or impossible to change. Some of these factors include:  Having chronic kidney disease.  Having a family history of high blood pressure.  Age. Risk increases with age.  Race. You may be at higher risk if you are African American.  Gender. Men are at higher risk than women before age 85. After age 68, women are at higher risk than men.  Having obstructive  sleep apnea.  Stress. What are the signs or symptoms? High blood pressure may not cause symptoms. Very high blood pressure (hypertensive crisis) may cause:  Headache.  Anxiety.  Shortness of breath.  Nosebleed.  Nausea and vomiting.  Vision changes.  Severe chest pain.  Seizures. How is this diagnosed? This condition is diagnosed by measuring your blood pressure while you are seated, with your arm resting on a flat surface, your legs uncrossed, and your feet flat on the floor. The cuff of the blood pressure monitor will be placed directly against the skin of your upper arm at the level of your heart. It should be measured at least twice using the same arm. Certain conditions can cause a difference in blood pressure between your right and left arms. Certain factors can cause blood pressure readings to be lower or higher than normal for a short period of time:  When your blood pressure is higher when you are in a health care provider's office than when you are at home, this is called white coat hypertension. Most people with this condition do not need medicines.  When your blood pressure is higher at home than when you are in a health care provider's office, this is called masked hypertension. Most people with this condition may need medicines to control blood pressure. If you have a high blood pressure reading during one visit or you have normal blood pressure with other risk factors, you may be asked to:  Return on a different day to have your blood pressure checked again.  Monitor your blood pressure at  home for 1 week or longer. If you are diagnosed with hypertension, you may have other blood or imaging tests to help your health care provider understand your overall risk for other conditions. How is this treated? This condition is treated by making healthy lifestyle changes, such as eating healthy foods, exercising more, and reducing your alcohol intake. Your health care provider  may prescribe medicine if lifestyle changes are not enough to get your blood pressure under control, and if:  Your systolic blood pressure is above 130.  Your diastolic blood pressure is above 80. Your personal target blood pressure may vary depending on your medical conditions, your age, and other factors. Follow these instructions at home: Eating and drinking   Eat a diet that is high in fiber and potassium, and low in sodium, added sugar, and fat. An example eating plan is called the DASH (Dietary Approaches to Stop Hypertension) diet. To eat this way: ? Eat plenty of fresh fruits and vegetables. Try to fill one half of your plate at each meal with fruits and vegetables. ? Eat whole grains, such as whole-wheat pasta, brown rice, or whole-grain bread. Fill about one fourth of your plate with whole grains. ? Eat or drink low-fat dairy products, such as skim milk or low-fat yogurt. ? Avoid fatty cuts of meat, processed or cured meats, and poultry with skin. Fill about one fourth of your plate with lean proteins, such as fish, chicken without skin, beans, eggs, or tofu. ? Avoid pre-made and processed foods. These tend to be higher in sodium, added sugar, and fat.  Reduce your daily sodium intake. Most people with hypertension should eat less than 1,500 mg of sodium a day.  Do not drink alcohol if: ? Your health care provider tells you not to drink. ? You are pregnant, may be pregnant, or are planning to become pregnant.  If you drink alcohol: ? Limit how much you use to:  0-1 drink a day for women.  0-2 drinks a day for men. ? Be aware of how much alcohol is in your drink. In the [place] one drink equals one 12 oz bottle of beer (355 mL), one 5 oz glass of wine (148 mL), or one 1 oz glass of hard liquor (44 mL). Lifestyle   Work with your health care provider to maintain a healthy body weight or to lose weight. Ask what an ideal weight is for you.  Get at least 30 minutes of exercise  most days of the week. Activities may include walking, swimming, or biking.  Include exercise to strengthen your muscles (resistance exercise), such as Pilates or lifting weights, as part of your weekly exercise routine. Try to do these types of exercises for 30 minutes at least 3 days a week.  Do not use any products that contain nicotine or tobacco, such as cigarettes, e-cigarettes, and chewing tobacco. If you need help quitting, ask your health care provider.  Monitor your blood pressure at home as told by your health care provider.  Keep all follow-up visits as told by your health care provider. This is important. Medicines  Take over-the-counter and prescription medicines only as told by your health care provider. Follow directions carefully. Blood pressure medicines must be taken as prescribed.  Do not skip doses of blood pressure medicine. Doing this puts you at risk for problems and can make the medicine less effective.  Ask your health care provider about side effects or reactions to medicines that you should watch for.  Contact a health care provider if you:  Think you are having a reaction to a medicine you are taking.  Have headaches that keep coming back (recurring).  Feel dizzy.  Have swelling in your ankles.  Have trouble with your vision. Get help right away if you:  Develop a severe headache or confusion.  Have unusual weakness or numbness.  Feel faint.  Have severe pain in your chest or abdomen.  Vomit repeatedly.  Have trouble breathing. Summary  Hypertension is when the force of blood pumping through your arteries is too strong. If this condition is not controlled, it may put you at risk for serious complications.  Your personal target blood pressure may vary depending on your medical conditions, your age, and other factors. For most people, a normal blood pressure is less than 120/80.  Hypertension is treated with lifestyle changes, medicines, or a  combination of both. Lifestyle changes include losing weight, eating a healthy, low-sodium diet, exercising more, and limiting alcohol. This information is not intended to replace advice given to you by your health care provider. Make sure you discuss any questions you have with your health care provider. Document Released: 05/16/2005 Document Revised: 01/24/2018 Document Reviewed: 01/24/2018 Elsevier Patient Education  [employer]2020 [employer]    If you have lab work done today you will be contacted with your lab results within the next 2 weeks.  If you have not heard from us then please contact us. The fastest way to get your results is to register for My Chart.   IF you received an x-ray today, you will receive an invoice from [facility]. Please contact [facility] at [phone] with questions or concerns regarding your invoice.   IF you received labwork today, you will receive an invoice from [facility]. Please contact [facility] at [phone] with questions or concerns regarding your invoice.   Our billing staff will not be able to assist you with questions regarding bills from these companies.  You will be contacted with the lab results as soon as they are available. The fastest way to get your results is to activate your My Chart account. Instructions are located on the last page of this paperwork. If you have not heard from us regarding the results in 2 weeks, please contact this office.

## 2019-01-03 NOTE — Progress Notes (Signed)
BP Readings from Last 3 Encounters:  01/03/19 129/79  06/07/17 122/80  01/03/17 (!) 130/93   Jeremiah [name] 53 y.o.   Chief Complaint  Patient presents with  . Edema    both ankles x 2 weeks    HISTORY OF PRESENT ILLNESS: This is a 53 y.o. male complaining of bilateral ankle swelling that started 2 weeks after his amlodipine dose was increased from 5 mg to 10 mg a day.  Denies abdominal pain, nausea or vomiting, or difficulty breathing.  Has no kidney, liver, or heart chronic conditions.  No history of CHF.  Daily alcohol consumption.  Denies flulike symptoms.  Denies any other significant symptoms.  HPI   Prior to Admission medications   Medication Sig Start Date End Date Taking? Authorizing Provider  ALPRAZolam Prudy Feeler) 0.5 MG tablet TAKE 1 TABLET BY MOUTH ONCE DAILY AT BEDTIME AS NEEDED FOR ANXIETY 07/25/17  Yes Wallis Bamberg, PA-C  amLODipine (NORVASC) 5 MG tablet Take 1 tablet (5 mg total) by mouth daily. 06/07/17  Yes Georgina Quint, MD  famotidine (PEPCID) 20 MG tablet TAKE 1 TABLET BY MOUTH ONCE DAILY AS NEEDED FOR  HEARTBURN  OR  INDIGESTION 02/05/18  Yes Wallis Bamberg, PA-C  losartan-hydrochlorothiazide (HYZAAR) 100-12.5 MG tablet Take 1 tablet by mouth daily. 06/07/17  Yes Georgina Quint, MD  pantoprazole (PROTONIX) 40 MG tablet TAKE 1 TABLET BY MOUTH ONCE DAILY 11/24/17  Yes Bessy Reaney, Eilleen Kempf, MD  predniSONE (DELTASONE) 20 MG tablet Take 2 tablets (40 mg total) by mouth daily with breakfast. 12/23/16  Yes Wallis Bamberg, PA-C  triamcinolone (NASACORT) 55 MCG/ACT AERO nasal inhaler Place 2 sprays into the nose daily. 06/07/17  Yes Jentry Mcqueary, Eilleen Kempf, MD  valACYclovir (VALTREX) 1000 MG tablet One by mouth twice a day for 3 days 03/06/15  Yes [name], Maylon Peppers, MD  atorvastatin (LIPITOR) 20 MG tablet Take 1 tablet (20 mg total) by mouth daily. 06/07/17 09/05/17  Georgina Quint, MD  ibuprofen (ADVIL,MOTRIN) 600 MG tablet Take 1 every 8 hours as needed for back pain.  Patient not taking: Reported on 01/03/2019 12/23/16   Wallis Bamberg, PA-C  nitroGLYCERIN (NITROSTAT) 0.4 MG SL tablet Place 1 tablet (0.4 mg total) under the tongue every 5 (five) minutes as needed for chest pain. Patient not taking: Reported on 01/03/2019 01/20/14   Robbie Lis M, PA-C  sucralfate (CARAFATE) 1 GM/10ML suspension Take 10 mLs (1 g total) by mouth 4 (four) times daily -  with meals and at bedtime. 03/13/15 03/20/15  Gerhard Munch, MD  hydrochlorothiazide (HYDRODIURIL) 25 MG tablet Take 25 mg by mouth daily.  08/15/11  [provider]    No Known Allergies  Patient Active Problem List   Diagnosis Date Noted  . Essential hypertension, benign   . Depression   . Hyperlipidemia   . Alcohol dependence (HCC)     Past Medical History:  Diagnosis Date  . Alcohol dependence (HCC)   . Anxiety   . Depression   . Essential hypertension, benign   . History of heavy alcohol consumption   . HSV-2 (herpes simplex virus 2) infection   . Hyperglycemia   . Hyperlipidemia   . Prostatitis   . Reflux     Past Surgical History:  Procedure Laterality Date  . CARDIAC CATHETERIZATION  2006  . CARDIOVASCULAR STRESS TEST  07/2011  . CHOLECYSTECTOMY  1990  . DOPPLER ECHOCARDIOGRAPHY  11/2005  . LEFT HEART CATHETERIZATION WITH CORONARY ANGIOGRAM N/A 02/14/2014   Procedure: LEFT  HEART CATHETERIZATION WITH CORONARY ANGIOGRAM;  Surgeon: Lennette Biharihomas A Kelly, MD;  [facility];  Service: Cardiovascular;  Laterality: N/A;  . ORIF FINGER FRACTURE  2009   rt ring  . SHOULDER ARTHROSCOPY WITH SUBACROMIAL DECOMPRESSION, ROTATOR CUFF REPAIR AND BICEP TENDON REPAIR Right 07/09/2013   Procedure: RIGHT SHOULDER ARTHROSCOPY WITH SUBACROMIAL DECOMPRESSION, DEBRIDEMENT,  SUBSCAPULARIS TENDON REPAIR;  Surgeon: Wyn Forsterobert V Sypher Jr., MD;  Location: [facility];  Service: Orthopedics;  Laterality: Right;  . SHOULDER SURGERY  1983   left    Social History   Socioeconomic History   . Marital status: Single    Spouse name: Not on file  . Number of children: Not on file  . Years of education: Not on file  . Highest education level: Not on file  Occupational History  . Not on file  Social Needs  . Financial resource strain: Not on file  . Food insecurity    Worry: Not on file    Inability: Not on file  . Transportation needs    Medical: Not on file    Non-medical: Not on file  Tobacco Use  . Smoking status: Former Smoker    Types: Cigarettes    Quit date: 07/02/1992    Years since quitting: 26.5  . Smokeless tobacco: Never Used  . Tobacco comment: quit 10-15 yrs ago  Substance and Sexual Activity  . Alcohol use: Yes    Alcohol/week: 0.0 standard drinks    Comment: 6-12 pack nightly-6-7 beers,occ vodka,drinks every night  . Drug use: No  . Sexual activity: Yes  Lifestyle  . Physical activity    Days per week: Not on file    Minutes per session: Not on file  . Stress: Not on file  Relationships  . Social Musicianconnections    Talks on phone: Not on file    Gets together: Not on file    Attends religious service: Not on file    Active member of club or organization: Not on file    Attends meetings of clubs or organizations: Not on file    Relationship status: Not on file  . Intimate partner violence    Fear of current or ex partner: Not on file    Emotionally abused: Not on file    Physically abused: Not on file    Forced sexual activity: Not on file  Other Topics Concern  . Not on file  Social History Narrative  . Not on file    Family History  Problem Relation Age of Onset  . Cancer Mother        breast  . Hyperlipidemia Mother   . Hypertension Mother   . Hyperlipidemia Father   . Hypertension Father   . Heart disease Father   . Hyperlipidemia Sister   . Hypertension Sister   . Stroke Maternal Grandmother   . Heart attack Maternal Grandmother   . Heart attack Maternal Grandfather   . Hyperlipidemia Sister   . Hypertension Sister       Review of Systems  Constitutional: Negative.  Negative for chills and fever.  HENT: Negative.  Negative for congestion and sore throat.   Eyes: Negative.   Respiratory: Negative.  Negative for cough and shortness of breath.   Cardiovascular: Negative for chest pain and palpitations.       Ankle edema  Gastrointestinal: Negative.  Negative for abdominal pain, blood in stool, diarrhea, melena, nausea and vomiting.  Genitourinary: Negative.  Negative for dysuria and hematuria.  Skin: Negative.  Negative for rash.  Neurological: Negative for dizziness and headaches.  All other systems reviewed and are negative.   Vitals:   01/03/19 1511  BP: 129/79  Pulse: 80  Resp: 16  Temp: 99.1 F (37.3 C)  SpO2: 97%    Physical Exam Vitals signs reviewed.  Constitutional:      Appearance: Normal appearance.  HENT:     Head: Normocephalic and atraumatic.     Nose: Nose normal.     Mouth/Throat:     Mouth: Mucous membranes are moist.     Pharynx: Oropharynx is clear.  Eyes:     Extraocular Movements: Extraocular movements intact.     Conjunctiva/sclera: Conjunctivae normal.     Pupils: Pupils are equal, round, and reactive to light.  Neck:     Musculoskeletal: Normal range of motion.  Cardiovascular:     Rate and Rhythm: Normal rate and regular rhythm.     Pulses: Normal pulses.     Heart sounds: Normal heart sounds.  Pulmonary:     Effort: Pulmonary effort is normal.     Breath sounds: Normal breath sounds.  Abdominal:     General: Bowel sounds are normal. There is no distension.     Palpations: Abdomen is soft.     Tenderness: There is no abdominal tenderness.  Musculoskeletal: Normal range of motion.     Comments: Mild bilateral ankle edema  Skin:    General: Skin is warm and dry.     Capillary Refill: Capillary refill takes less than 2 seconds.  Neurological:     General: No focal deficit present.     Mental Status: He is alert and oriented to person, place, and time.   Psychiatric:        Mood and Affect: Mood normal.        Behavior: Behavior normal.     A total of 25 minutes was spent in the room with the patient, greater than 50% of which was in counseling/coordination of care regarding differential diagnosis of ankle edema, treatment, medication dose adjustment, diet in particular sodium restriction, need for blood work, prognosis, and need for follow-up.   ASSESSMENT & PLAN: [name] was seen today for edema.  Diagnoses and all orders for this visit:  Ankle edema, bilateral -     CBC with Differential/Platelet -     Comprehensive metabolic panel  Need for diphtheria-tetanus-pertussis (Tdap) vaccine -     Tdap vaccine greater than or equal to 7yo IM  Medication side effect    Patient Instructions    Decrease amlodipine dose to 5 mg a day.  Continue losartan/HCTZ.  Monitor blood pressure at home daily. Hypertension, Adult High blood pressure (hypertension) is when the force of blood pumping through the arteries is too strong. The arteries are the blood vessels that carry blood from the heart throughout the body. Hypertension forces the heart to work harder to pump blood and may cause arteries to become narrow or stiff. Untreated or uncontrolled hypertension can cause a heart attack, heart failure, a stroke, kidney disease, and other problems. A blood pressure reading consists of a higher number over a lower number. Ideally, your blood pressure should be below 120/80. The first ("top") number is called the systolic pressure. It is a measure of the pressure in your arteries as your heart beats. The second ("bottom") number is called the diastolic pressure. It is a measure of the pressure in your arteries as the heart relaxes. What are the  causes? The exact cause of this condition is not known. There are some conditions that result in or are related to high blood pressure. What increases the risk? Some risk factors for high blood pressure are under  your control. The following factors may make you more likely to develop this condition:  Smoking.  Having type 2 diabetes mellitus, high cholesterol, or both.  Not getting enough exercise or physical activity.  Being overweight.  Having too much fat, sugar, calories, or salt (sodium) in your diet.  Drinking too much alcohol. Some risk factors for high blood pressure may be difficult or impossible to change. Some of these factors include:  Having chronic kidney disease.  Having a family history of high blood pressure.  Age. Risk increases with age.  Race. You may be at higher risk if you are African American.  Gender. Men are at higher risk than women before age 73. After age 4, women are at higher risk than men.  Having obstructive sleep apnea.  Stress. What are the signs or symptoms? High blood pressure may not cause symptoms. Very high blood pressure (hypertensive crisis) may cause:  Headache.  Anxiety.  Shortness of breath.  Nosebleed.  Nausea and vomiting.  Vision changes.  Severe chest pain.  Seizures. How is this diagnosed? This condition is diagnosed by measuring your blood pressure while you are seated, with your arm resting on a flat surface, your legs uncrossed, and your feet flat on the floor. The cuff of the blood pressure monitor will be placed directly against the skin of your upper arm at the level of your heart. It should be measured at least twice using the same arm. Certain conditions can cause a difference in blood pressure between your right and left arms. Certain factors can cause blood pressure readings to be lower or higher than normal for a short period of time:  When your blood pressure is higher when you are in a health care provider's office than when you are at home, this is called white coat hypertension. Most people with this condition do not need medicines.  When your blood pressure is higher at home than when you are in a health  care provider's office, this is called masked hypertension. Most people with this condition may need medicines to control blood pressure. If you have a high blood pressure reading during one visit or you have normal blood pressure with other risk factors, you may be asked to:  Return on a different day to have your blood pressure checked again.  Monitor your blood pressure at home for 1 week or longer. If you are diagnosed with hypertension, you may have other blood or imaging tests to help your health care provider understand your overall risk for other conditions. How is this treated? This condition is treated by making healthy lifestyle changes, such as eating healthy foods, exercising more, and reducing your alcohol intake. Your health care provider may prescribe medicine if lifestyle changes are not enough to get your blood pressure under control, and if:  Your systolic blood pressure is above 130.  Your diastolic blood pressure is above 80. Your personal target blood pressure may vary depending on your medical conditions, your age, and other factors. Follow these instructions at home: Eating and drinking   Eat a diet that is high in fiber and potassium, and low in sodium, added sugar, and fat. An example eating plan is called the DASH (Dietary Approaches to Stop Hypertension) diet. To eat this  way: ? Eat plenty of fresh fruits and vegetables. Try to fill one half of your plate at each meal with fruits and vegetables. ? Eat whole grains, such as whole-wheat pasta, brown rice, or whole-grain bread. Fill about one fourth of your plate with whole grains. ? Eat or drink low-fat dairy products, such as skim milk or low-fat yogurt. ? Avoid fatty cuts of meat, processed or cured meats, and poultry with skin. Fill about one fourth of your plate with lean proteins, such as fish, chicken without skin, beans, eggs, or tofu. ? Avoid pre-made and processed foods. These tend to be higher in sodium,  added sugar, and fat.  Reduce your daily sodium intake. Most people with hypertension should eat less than 1,500 mg of sodium a day.  Do not drink alcohol if: ? Your health care provider tells you not to drink. ? You are pregnant, may be pregnant, or are planning to become pregnant.  If you drink alcohol: ? Limit how much you use to:  0-1 drink a day for women.  0-2 drinks a day for men. ? Be aware of how much alcohol is in your drink. In the [place] one drink equals one 12 oz bottle of beer (355 mL), one 5 oz glass of wine (148 mL), or one 1 oz glass of hard liquor (44 mL). Lifestyle   Work with your health care provider to maintain a healthy body weight or to lose weight. Ask what an ideal weight is for you.  Get at least 30 minutes of exercise most days of the week. Activities may include walking, swimming, or biking.  Include exercise to strengthen your muscles (resistance exercise), such as Pilates or lifting weights, as part of your weekly exercise routine. Try to do these types of exercises for 30 minutes at least 3 days a week.  Do not use any products that contain nicotine or tobacco, such as cigarettes, e-cigarettes, and chewing tobacco. If you need help quitting, ask your health care provider.  Monitor your blood pressure at home as told by your health care provider.  Keep all follow-up visits as told by your health care provider. This is important. Medicines  Take over-the-counter and prescription medicines only as told by your health care provider. Follow directions carefully. Blood pressure medicines must be taken as prescribed.  Do not skip doses of blood pressure medicine. Doing this puts you at risk for problems and can make the medicine less effective.  Ask your health care provider about side effects or reactions to medicines that you should watch for. Contact a health care provider if you:  Think you are having a reaction to a medicine you are taking.  Have  headaches that keep coming back (recurring).  Feel dizzy.  Have swelling in your ankles.  Have trouble with your vision. Get help right away if you:  Develop a severe headache or confusion.  Have unusual weakness or numbness.  Feel faint.  Have severe pain in your chest or abdomen.  Vomit repeatedly.  Have trouble breathing. Summary  Hypertension is when the force of blood pumping through your arteries is too strong. If this condition is not controlled, it may put you at risk for serious complications.  Your personal target blood pressure may vary depending on your medical conditions, your age, and other factors. For most people, a normal blood pressure is less than 120/80.  Hypertension is treated with lifestyle changes, medicines, or a combination of both. Lifestyle changes include losing  weight, eating a healthy, low-sodium diet, exercising more, and limiting alcohol. This information is not intended to replace advice given to you by your health care provider. Make sure you discuss any questions you have with your health care provider. Document Released: 05/16/2005 Document Revised: 01/24/2018 Document Reviewed: 01/24/2018 Elsevier Patient Education  [employer].    If you have lab work done today you will be contacted with your lab results within the next 2 weeks.  If you have not heard from [place] then please contact us. The fastest way to get your results is to register for My Chart.   IF you received an x-ray today, you will receive an invoice from [facility]. Please contact [facility] at [phone] with questions or concerns regarding your invoice.   IF you received labwork today, you will receive an invoice from [facility]. Please contact [facility] at [phone] with questions or concerns regarding your invoice.   Our billing staff will not be able to assist you with questions regarding bills from these companies.  You will be contacted with  the lab results as soon as they are available. The fastest way to get your results is to activate your My Chart account. Instructions are located on the last page of this paperwork. If you have not heard from [place] regarding the results in 2 weeks, please contact this office.         Edwina Barth, MD [facility]

## 2019-01-04 LAB — COMPREHENSIVE METABOLIC PANEL
ALT: 19 IU/L (ref 0–44)
AST: 25 IU/L (ref 0–40)
Albumin/Globulin Ratio: 2.7 — ABNORMAL HIGH (ref 1.2–2.2)
Albumin: 4.8 g/dL (ref 3.8–4.9)
Alkaline Phosphatase: 70 IU/L (ref 39–117)
BUN/Creatinine Ratio: 18 (ref 9–20)
BUN: 16 mg/dL (ref 6–24)
Bilirubin Total: 0.6 mg/dL (ref 0.0–1.2)
CO2: 23 mmol/L (ref 20–29)
Calcium: 9.5 mg/dL (ref 8.7–10.2)
Chloride: 103 mmol/L (ref 96–106)
Creatinine, Ser: 0.9 mg/dL (ref 0.76–1.27)
GFR calc Af Amer: 111 mL/min/{1.73_m2} (ref >59)
GFR calc non Af Amer: 96 mL/min/{1.73_m2} (ref >59)
Globulin, Total: 1.8 g/dL (ref 1.5–4.5)
Glucose: 118 mg/dL — ABNORMAL HIGH (ref 65–99)
Potassium: 3.8 mmol/L (ref 3.5–5.2)
Sodium: 144 mmol/L (ref 134–144)
Total Protein: 6.6 g/dL (ref 6.0–8.5)

## 2019-01-04 LAB — CBC WITH DIFFERENTIAL/PLATELET
Basophils Absolute: 0 10*3/uL (ref 0.0–0.2)
Basos: 1 %
EOS (ABSOLUTE): 0.1 10*3/uL (ref 0.0–0.4)
Eos: 2 %
Hematocrit: 38.9 % (ref 37.5–51.0)
Hemoglobin: 13.5 g/dL (ref 13.0–17.7)
Immature Grans (Abs): 0 10*3/uL (ref 0.0–0.1)
Immature Granulocytes: 0 %
Lymphocytes Absolute: 1.4 10*3/uL (ref 0.7–3.1)
Lymphs: 26 %
MCH: 32.1 pg (ref 26.6–33.0)
MCHC: 34.7 g/dL (ref 31.5–35.7)
MCV: 92 fL (ref 79–97)
Monocytes Absolute: 0.5 10*3/uL (ref 0.1–0.9)
Monocytes: 9 %
Neutrophils Absolute: 3.4 10*3/uL (ref 1.4–7.0)
Neutrophils: 62 %
Platelets: 188 10*3/uL (ref 150–450)
RBC: 4.21 x10E6/uL (ref 4.14–5.80)
RDW: 12 % (ref 11.6–15.4)
WBC: 5.4 10*3/uL (ref 3.4–10.8)

## 2019-03-18 ENCOUNTER — Ambulatory Visit: Payer: Commercial Managed Care - PPO | Admitting: Urology

## 2019-03-18 ENCOUNTER — Other Ambulatory Visit: Payer: Self-pay | Admitting: Urology

## 2019-03-18 DIAGNOSIS — Z0189 Encounter for other specified special examinations: Secondary | ICD-10-CM

## 2019-03-19 ENCOUNTER — Ambulatory Visit: Payer: Commercial Managed Care - PPO | Admitting: Urology

## 2019-06-05 ENCOUNTER — Ambulatory Visit (INDEPENDENT_AMBULATORY_CARE_PROVIDER_SITE_OTHER): Payer: 59 | Admitting: Emergency Medicine

## 2019-06-05 ENCOUNTER — Other Ambulatory Visit: Payer: Self-pay

## 2019-06-05 ENCOUNTER — Encounter: Payer: Self-pay | Admitting: Emergency Medicine

## 2019-06-05 VITALS — BP 152/102 | HR 89 | Temp 97.5°F | Ht 68.0 in | Wt 183.0 lb

## 2019-06-05 DIAGNOSIS — F4321 Adjustment disorder with depressed mood: Secondary | ICD-10-CM | POA: Diagnosis not present

## 2019-06-05 DIAGNOSIS — E782 Mixed hyperlipidemia: Secondary | ICD-10-CM

## 2019-06-05 DIAGNOSIS — I10 Essential (primary) hypertension: Secondary | ICD-10-CM | POA: Diagnosis not present

## 2019-06-05 MED ORDER — ESCITALOPRAM OXALATE 10 MG PO TABS: 10.0000 mg | ORAL_TABLET | Freq: Every day | ORAL | 1 refills | Status: DC

## 2019-06-05 NOTE — Patient Instructions (Addendum)
   If you have lab work done today you will be contacted with your lab results within the next 2 weeks.  If you have not heard from us then please contact us. The fastest way to get your results is to register for My Chart.   IF you received an x-ray today, you will receive an invoice from [facility]. Please contact [facility] at [phone] with questions or concerns regarding your invoice.   IF you received labwork today, you will receive an invoice from [facility]. Please contact [facility] at [phone] with questions or concerns regarding your invoice.   Our billing staff will not be able to assist you with questions regarding bills from these companies.  You will be contacted with the lab results as soon as they are available. The fastest way to get your results is to activate your My Chart account. Instructions are located on the last page of this paperwork. If you have not heard from us regarding the results in 2 weeks, please contact this office.     Major Depressive Disorder, Adult Major depressive disorder (MDD) is a mental health condition. MDD often makes you feel sad, hopeless, or helpless. MDD can also cause symptoms in your body. MDD can affect your:  Work.  School.  Relationships.  Other normal activities. MDD can range from mild to very bad. It may occur once (single episode MDD). It can also occur many times (recurrent MDD). The main symptoms of MDD often include:  Feeling sad, depressed, or irritable most of the time.  Loss of interest. MDD symptoms also include:  Sleeping too much or too little.  Eating too much or too little.  A change in your weight.  Feeling tired (fatigue) or having low energy.  Feeling worthless.  Feeling guilty.  Trouble making decisions.  Trouble thinking clearly.  Thoughts of suicide or harming others.  Feeling weak.  Feeling agitated.  Keeping yourself from being around other people  (isolation). Follow these instructions at home: Activity  Do these things as told by your doctor: ? Go back to your normal activities. ? Exercise regularly. ? Spend time outdoors. Alcohol  Talk with your doctor about how alcohol can affect your antidepressant medicines.  Do not drink alcohol. Or, limit how much alcohol you drink. ? This means no more than 1 drink a day for nonpregnant women and 2 drinks a day for men. One drink equals one of these:  12 oz of beer.  5 oz of wine.  1 oz of hard liquor. General instructions  Take over-the-counter and prescription medicines only as told by your doctor.  Eat a healthy diet.  Get plenty of sleep.  Find activities that you enjoy. Make time to do them.  Think about joining a support group. Your doctor may be able to suggest a group for you.  Keep all follow-up visits as told by your doctor. This is important. Where to find more information:  National Alliance on Mental Illness: ? www.nami.org  [place] National Institute of Mental Health: ? www.nimh.nih.gov  National Suicide Prevention Lifeline: ? 1-800-273-8255. This is free, 24-hour help. Contact a doctor if:  Your symptoms get worse.  You have new symptoms. Get help right away if:  You self-harm.  You see, hear, taste, smell, or feel things that are not present (hallucinate). If you ever feel like you may hurt yourself or others, or have thoughts about taking your own life, get help right away. You can go to your   nearest emergency department or call:  Your local emergency services (911 in the [place]  A suicide crisis helpline, such as the National Suicide Prevention Lifeline: ? 1-800-273-8255. This is open 24 hours a day. This information is not intended to replace advice given to you by your health care provider. Make sure you discuss any questions you have with your health care provider. Document Revised: 04/28/2017 Document Reviewed: 01/31/2016 Elsevier Patient  Education  2020 [employer] Inc.  

## 2019-06-05 NOTE — Progress Notes (Signed)
Jeremiah [name] 54 y.o.   Chief Complaint  Patient presents with  . Anxiety    no panic attacks. very stressed per pt.    HISTORY OF PRESENT ILLNESS: This is a 54 y.o. male complaining of depression/anxiety for the past several weeks due to relationship problems.  Presently seen a psychiatrist/therapist but not on medication.  Requesting antidepression/anxiety medication. Also has a history of hypertension. Chronic daily EtOH user and at times in excess. History of alcohol dependence. No other complaints or medical concerns today. Depression screen [facility] 2/9 06/05/2019 01/03/2019 01/03/2017 12/23/2016 01/25/2016  Decreased Interest 0 0 0 0 0  Down, Depressed, Hopeless 3 0 0 0 0  PHQ - 2 Score 3 0 0 0 0  Altered sleeping 3 - - - -  Tired, decreased energy 3 - - - -  Change in appetite 2 - - - -  Feeling bad or failure about yourself  0 - - - -  Trouble concentrating 2 - - - -  Moving slowly or fidgety/restless 2 - - - -  Suicidal thoughts 0 - - - -  PHQ-9 Score 15 - - - -     HPI   Prior to Admission medications   Medication Sig Start Date End Date Taking? Authorizing Provider  famotidine (PEPCID) 20 MG tablet TAKE 1 TABLET BY MOUTH ONCE DAILY AS NEEDED FOR  HEARTBURN  OR  INDIGESTION 02/05/18  Yes Wallis Bamberg, PA-C  ibuprofen (ADVIL,MOTRIN) 600 MG tablet Take 1 every 8 hours as needed for back pain. 12/23/16  Yes Wallis Bamberg, PA-C  losartan-hydrochlorothiazide (HYZAAR) 100-12.5 MG tablet Take 1 tablet by mouth daily. 06/07/17  Yes Talyn Dessert, Eilleen Kempf, MD  nitroGLYCERIN (NITROSTAT) 0.4 MG SL tablet Place 1 tablet (0.4 mg total) under the tongue every 5 (five) minutes as needed for chest pain. 01/20/14  Yes Sharol Harness, Brittainy M, PA-C  omeprazole (PRILOSEC) 20 MG capsule Take by mouth. 08/20/18  Yes [provider]  pantoprazole (PROTONIX) 40 MG tablet TAKE 1 TABLET BY MOUTH ONCE DAILY 11/24/17  Yes Dastan Krider, Eilleen Kempf, MD  predniSONE (DELTASONE) 20 MG tablet Take 2 tablets (40 mg  total) by mouth daily with breakfast. 12/23/16  Yes Wallis Bamberg, PA-C  triamcinolone (NASACORT) 55 MCG/ACT AERO nasal inhaler Place 2 sprays into the nose daily. 06/07/17  Yes Abdulrahman Bracey, Eilleen Kempf, MD  valACYclovir (VALTREX) 1000 MG tablet One by mouth twice a day for 3 days 03/06/15  Yes [name], Maylon Peppers, MD  ALPRAZolam Prudy Feeler) 0.5 MG tablet TAKE 1 TABLET BY MOUTH ONCE DAILY AT BEDTIME AS NEEDED FOR ANXIETY Patient not taking: Reported on 06/05/2019 07/25/17   Wallis Bamberg, PA-C  amLODipine (NORVASC) 5 MG tablet Take 1 tablet (5 mg total) by mouth daily. Patient not taking: Reported on 06/05/2019 06/07/17   Georgina Quint, MD  atorvastatin (LIPITOR) 20 MG tablet Take 1 tablet (20 mg total) by mouth daily. 06/07/17 09/05/17  Georgina Quint, MD  diazepam (VALIUM) 5 MG tablet Take by mouth.    [provider]  sucralfate (CARAFATE) 1 GM/10ML suspension Take 10 mLs (1 g total) by mouth 4 (four) times daily -  with meals and at bedtime. 03/13/15 03/20/15  Gerhard Munch, MD  hydrochlorothiazide (HYDRODIURIL) 25 MG tablet Take 25 mg by mouth daily.  08/15/11  [provider]    No Known Allergies  Patient Active Problem List   Diagnosis Date Noted  . Essential hypertension, benign   . Depression   . Hyperlipidemia   .  Alcohol dependence (HCC)     Past Medical History:  Diagnosis Date  . Alcohol dependence (HCC)   . Anxiety   . Depression   . Essential hypertension, benign   . History of heavy alcohol consumption   . HSV-2 (herpes simplex virus 2) infection   . Hyperglycemia   . Hyperlipidemia   . Prostatitis   . Reflux     Past Surgical History:  Procedure Laterality Date  . CARDIAC CATHETERIZATION  2006  . CARDIOVASCULAR STRESS TEST  07/2011  . CHOLECYSTECTOMY  1990  . DOPPLER ECHOCARDIOGRAPHY  11/2005  . LEFT HEART CATHETERIZATION WITH CORONARY ANGIOGRAM N/A 02/14/2014   Procedure: LEFT HEART CATHETERIZATION WITH CORONARY ANGIOGRAM;  Surgeon: Lennette Bihari, MD;   Location: [facility] CATH LAB;  Service: Cardiovascular;  Laterality: N/A;  . ORIF FINGER FRACTURE  2009   rt ring  . SHOULDER ARTHROSCOPY WITH SUBACROMIAL DECOMPRESSION, ROTATOR CUFF REPAIR AND BICEP TENDON REPAIR Right 07/09/2013   Procedure: RIGHT SHOULDER ARTHROSCOPY WITH SUBACROMIAL DECOMPRESSION, DEBRIDEMENT,  SUBSCAPULARIS TENDON REPAIR;  Surgeon: Wyn Forster., MD;  Location: [facility];  Service: Orthopedics;  Laterality: Right;  . SHOULDER SURGERY  1983   left    Social History   Socioeconomic History  . Marital status: Single    Spouse name: Not on file  . Number of children: Not on file  . Years of education: Not on file  . Highest education level: Not on file  Occupational History  . Not on file  Tobacco Use  . Smoking status: Former Smoker    Types: Cigarettes    Quit date: 07/02/1992    Years since quitting: 26.9  . Smokeless tobacco: Never Used  . Tobacco comment: quit 10-15 yrs ago  Substance and Sexual Activity  . Alcohol use: Yes    Alcohol/week: 0.0 standard drinks    Comment: 6-12 pack nightly-6-7 beers,occ vodka,drinks every night  . Drug use: No  . Sexual activity: Yes  Other Topics Concern  . Not on file  Social History Narrative  . Not on file   Social Determinants of Health   Financial Resource Strain:   . Difficulty of Paying Living Expenses: Not on file  Food Insecurity:   . Worried About Programme researcher, broadcasting/film/video in the Last Year: Not on file  . Ran Out of Food in the Last Year: Not on file  Transportation Needs:   . Lack of Transportation (Medical): Not on file  . Lack of Transportation (Non-Medical): Not on file  Physical Activity:   . Days of Exercise per Week: Not on file  . Minutes of Exercise per Session: Not on file  Stress:   . Feeling of Stress : Not on file  Social Connections:   . Frequency of Communication with Friends and Family: Not on file  . Frequency of Social Gatherings with Friends and Family: Not on file  .  Attends Religious Services: Not on file  . Active Member of Clubs or Organizations: Not on file  . Attends Banker Meetings: Not on file  . Marital Status: Not on file  Intimate Partner Violence:   . Fear of Current or Ex-Partner: Not on file  . Emotionally Abused: Not on file  . Physically Abused: Not on file  . Sexually Abused: Not on file    Family History  Problem Relation Age of Onset  . Cancer Mother        breast  . Hyperlipidemia Mother   .  Hypertension Mother   . Hyperlipidemia Father   . Hypertension Father   . Heart disease Father   . Hyperlipidemia Sister   . Hypertension Sister   . Stroke Maternal Grandmother   . Heart attack Maternal Grandmother   . Heart attack Maternal Grandfather   . Hyperlipidemia Sister   . Hypertension Sister      Review of Systems  Constitutional: Negative.  Negative for chills and fever.  HENT: Negative.  Negative for congestion and sore throat.   Eyes: Negative.   Respiratory: Negative.  Negative for cough and shortness of breath.   Cardiovascular: Negative.  Negative for chest pain and palpitations.  Gastrointestinal: Negative.  Negative for abdominal pain, diarrhea, nausea and vomiting.  Genitourinary: Negative.  Negative for dysuria.  Skin: Negative.  Negative for rash.  Neurological: Negative.  Negative for dizziness.  Endo/Heme/Allergies: Negative.   Psychiatric/Behavioral: Positive for depression. The patient is nervous/anxious.   All other systems reviewed and are negative.  Today's Vitals   06/05/19 1000  BP: (!) 152/102  Pulse: 89  Temp: (!) 97.5 F (36.4 C)  TempSrc: Temporal  SpO2: 98%  Weight: 183 lb (83 kg)  Height: 5\' 8"  (1.727 m)   Body mass index is 27.83 kg/m.   Physical Exam Vitals reviewed.  Constitutional:      Appearance: Normal appearance.  HENT:     Head: Normocephalic.  Eyes:     Extraocular Movements: Extraocular movements intact.     Pupils: Pupils are equal, round, and  reactive to light.  Cardiovascular:     Rate and Rhythm: Normal rate and regular rhythm.     Heart sounds: Normal heart sounds.  Pulmonary:     Effort: Pulmonary effort is normal.     Breath sounds: Normal breath sounds.  Musculoskeletal:        General: Normal range of motion.     Cervical back: Normal range of motion and neck supple.  Skin:    General: Skin is warm and dry.     Capillary Refill: Capillary refill takes less than 2 seconds.  Neurological:     General: No focal deficit present.     Mental Status: He is alert and oriented to person, place, and time.      ASSESSMENT & PLAN: Veron was seen today for anxiety.  Diagnoses and all orders for this visit:  Situational depression -     escitalopram (LEXAPRO) 10 MG tablet; Take 1 tablet (10 mg total) by mouth daily.  Essential hypertension  Mixed hyperlipidemia   Will follow up with his present psychiatrist this week.   Patient Instructions       If you have lab work done today you will be contacted with your lab results within the next 2 weeks.  If you have not heard from Maisie Fus then please contact us. The fastest way to get your results is to register for My Chart.   IF you received an x-ray today, you will receive an invoice from [facility]. Please contact [facility] at [phone] with questions or concerns regarding your invoice.   IF you received labwork today, you will receive an invoice from [place]. Please contact [facility] at [phone] with questions or concerns regarding your invoice.   Our billing staff will not be able to assist you with questions regarding bills from these companies.  You will be contacted with the lab results as soon as they are available. The fastest way to get your results is to activate your  My Chart account. Instructions are located on the last page of this paperwork. If you have not heard from [place] regarding the results in 2 weeks, please contact this  office.     Major Depressive Disorder, Adult Major depressive disorder (MDD) is a mental health condition. MDD often makes you feel sad, hopeless, or helpless. MDD can also cause symptoms in your body. MDD can affect your:  Work.  School.  Relationships.  Other normal activities. MDD can range from mild to very bad. It may occur once (single episode MDD). It can also occur many times (recurrent MDD). The main symptoms of MDD often include:  Feeling sad, depressed, or irritable most of the time.  Loss of interest. MDD symptoms also include:  Sleeping too much or too little.  Eating too much or too little.  A change in your weight.  Feeling tired (fatigue) or having low energy.  Feeling worthless.  Feeling guilty.  Trouble making decisions.  Trouble thinking clearly.  Thoughts of suicide or harming others.  Feeling weak.  Feeling agitated.  Keeping yourself from being around other people (isolation). Follow these instructions at home: Activity  Do these things as told by your doctor: ? Go back to your normal activities. ? Exercise regularly. ? Spend time outdoors. Alcohol  Talk with your doctor about how alcohol can affect your antidepressant medicines.  Do not drink alcohol. Or, limit how much alcohol you drink. ? This means no more than 1 drink a day for nonpregnant women and 2 drinks a day for men. One drink equals one of these:  12 oz of beer.  5 oz of wine.  1 oz of hard liquor. General instructions  Take over-the-counter and prescription medicines only as told by your doctor.  Eat a healthy diet.  Get plenty of sleep.  Find activities that you enjoy. Make time to do them.  Think about joining a support group. Your doctor may be able to suggest a group for you.  Keep all follow-up visits as told by your doctor. This is important. Where to find more information:  [employer] on Mental Illness: ? www.nami.[place]: ? https://carter.com/  National Suicide Prevention Lifeline: ? 334-169-7636. This is free, 24-hour help. Contact a doctor if:  Your symptoms get worse.  You have new symptoms. Get help right away if:  You self-harm.  You see, hear, taste, smell, or feel things that are not present (hallucinate). If you ever feel like you may hurt yourself or others, or have thoughts about taking your own life, get help right away. You can go to your nearest emergency department or call:  Your local emergency services (911 in the [place]  A suicide crisis helpline, such as the National Suicide Prevention Lifeline: ? 782-452-6086. This is open 24 hours a day. This information is not intended to replace advice given to you by your health care provider. Make sure you discuss any questions you have with your health care provider. Document Revised: 04/28/2017 Document Reviewed: 01/31/2016 Elsevier Patient Education  2020 Elsevier Inc.      Agustina Caroli, MD [facility]

## 2019-06-11 ENCOUNTER — Ambulatory Visit: Payer: Commercial Managed Care - PPO | Admitting: Urology

## 2019-06-26 LAB — UNMAPPED LAB RESULTS
Hematocrit (HT): 41 % — NL (ref 40–52)
Hemoglobin (HGB) (HT): 14.2 g/dL — NL (ref 13.0–17.5)
MCHC (HT): 34.6 g/dL — NL (ref 32.0–36.0)
MCV (HT): 91.7 fL — NL (ref 81.0–99.0)
Mean Corpuscular Hemoglobin (MCH) (HT): 31.8 pg — NL (ref 26.0–34.0)
Platelets (HT): 181 10 3/uL — NL (ref 140–400)
RBC (HT): 4.47 10 6/uL — NL (ref 4.20–5.90)
RDW (HT): 12.6 % — NL (ref 11.5–15.0)
WBC (HT): 4.4 10 3/uL — NL (ref 4.0–10.8)

## 2019-07-05 ENCOUNTER — Ambulatory Visit: Payer: 59 | Admitting: Emergency Medicine

## 2019-07-08 ENCOUNTER — Encounter: Payer: Self-pay | Admitting: Gastroenterology

## 2019-07-08 ENCOUNTER — Other Ambulatory Visit: Payer: Self-pay | Admitting: Urology

## 2019-07-08 DIAGNOSIS — R3989 Other symptoms and signs involving the genitourinary system: Secondary | ICD-10-CM

## 2019-07-09 ENCOUNTER — Encounter: Payer: Self-pay | Admitting: Urology

## 2019-07-09 ENCOUNTER — Ambulatory Visit: Payer: Commercial Managed Care - PPO | Admitting: Urology

## 2019-07-09 ENCOUNTER — Telehealth: Payer: Self-pay

## 2019-07-09 VITALS — Ht 69.0 in | Wt 185.0 lb

## 2019-07-09 DIAGNOSIS — N529 Male erectile dysfunction, unspecified: Secondary | ICD-10-CM

## 2019-07-09 DIAGNOSIS — R3989 Other symptoms and signs involving the genitourinary system: Secondary | ICD-10-CM

## 2019-07-09 DIAGNOSIS — N401 Enlarged prostate with lower urinary tract symptoms: Secondary | ICD-10-CM

## 2019-07-09 LAB — POCT URINALYSIS DIPSTICK
Blood,UA POCT: NEGATIVE
Glucose,UA POCT: NORMAL mg/dL
Ketones,UA POCT: NEGATIVE mg/dL
Leuk Esterase,UA POCT: NEGATIVE
Lot #: 44399003
Nitrite,UA POCT: NEGATIVE
PH,UA POCT: 6 (ref 5–8)
Protein,UA POCT: NEGATIVE mg/dL
Specific gravity,UA POCT: 1 (ref 1.002–1.030)

## 2019-07-09 MED ORDER — TADALAFIL 5 MG PO TABS *I*
5.0000 mg | ORAL_TABLET | Freq: Every day | ORAL | 11 refills | Status: DC
Start: 2019-07-09 — End: 2019-08-09

## 2019-07-09 NOTE — Telephone Encounter (Signed)
Copied from CRM [id number]. Topic: Appointments - Schedule Appointment  >> Jul 09, 2019  1:22 PM Caroline Sauger wrote:  Patient calling in to schedule an appointment, he states he is being referred to GI due to elevated enzymes which he states has tripled. Patient is being referred to [place] by Dr. Freddie Apley.  Patient can be reached at [phone]

## 2019-07-09 NOTE — Progress Notes (Signed)
Chief Complaint : LUTS    HPI : Kyle Taylor is a 54 y.o. male who is being seen today for evaluation of LUTS    Prior antibiotics for prostatitis which seem to have resolved his symptoms  Some weakened flow  Nocturia x 0  No frequency or urgency  No hematuria or dysuria     On cialis daily for both LUTS and mild ED  Happy with symptoms at this time    Medications : has a current medication list which includes the following prescription(s): omeprazole, tadalafil, amlodipine, atorvastatin, losartan-hydrochlorothiazide, alprazolam, and ibuprofen.  Allergies : No Known Allergies (drug, envir, food or latex)                                                                                           Review of Systems   Constitutional: Negative.    HENT: Negative.    Eyes: Negative.    Respiratory: Negative.    Cardiovascular: Negative.    Gastrointestinal: Negative.    Endocrine: Negative.    Genitourinary: Negative.    Musculoskeletal: Negative.    Skin: Negative.    Allergic/Immunologic: Negative.    Neurological: Negative.    Hematological: Negative.    Psychiatric/Behavioral: Negative.        Past Medical History :   Past Medical History:   Diagnosis Date    Anxiety     GERD (gastroesophageal reflux disease)     Hypertension      Surgical History :  has a past surgical history that includes Colonoscopy and Cholecystectomy.  Family History : family history includes Cancer in his mother; GERD in his father, mother, and sister; Heart Disease in his father and maternal grandmother; High Blood Pressure in his father, mother, and sister.  Social History :  reports that he has quit smoking. His smoking use included cigarettes. He has never used smokeless tobacco. He reports current alcohol use. He reports that he does not use drugs.    Physical Exam   Constitutional: He is oriented to person, place, and time. He appears well-nourished.   HENT:   Head: Normocephalic and atraumatic.   Pulmonary/Chest: Effort normal. No  respiratory distress.   Genitourinary:    Genitourinary Comments: DRE - patient declined at this time     Musculoskeletal: Normal range of motion.   Neurological: He is alert and oriented to person, place, and time.   Vitals reviewed.      Lab Review  Urine analysis shows :   Recent Results (from the past 24 hour(s))   POCT urinalysis dipstick    Collection Time: 07/09/19 11:13 AM   Result Value Ref Range    Specific gravity,UA POCT 1.000 1.002 - 1.030    PH,UA POCT 6.0 5 - 8    Leuk Esterase,UA POCT Negative Negative    Nitrite,UA POCT Negative Negative    Protein,UA POCT Negative Negative mg/dL    Glucose,UA POCT Normal Normal mg/dL    Ketones,UA POCT Negative Negative mg/dL    Urobilinogen,UA      Bilirubin,Ur      Blood,UA POCT Negative Negative    Exp date  11/27/2019     Lot # [id number]        PSA 06/26/19: 2.09      Assessment:   54 yo M with mild LUTS and ED  Normal PSA, declined DRE    Plan:   Plan to continue cialis daily for LUTS/ED  Will monitor symptoms, PVR as needed    Annual PSA/DRE    RTC prn

## 2019-07-17 ENCOUNTER — Other Ambulatory Visit: Payer: Self-pay | Admitting: Gastroenterology

## 2019-08-09 ENCOUNTER — Other Ambulatory Visit: Payer: Self-pay | Admitting: Urology

## 2019-08-09 MED ORDER — TADALAFIL 5 MG PO TABS *I*
5.0000 mg | ORAL_TABLET | Freq: Every day | ORAL | 11 refills | Status: DC
Start: 2019-08-09 — End: 2019-08-09

## 2019-08-09 NOTE — Telephone Encounter (Signed)
New pharmacy request

## 2019-08-12 ENCOUNTER — Other Ambulatory Visit: Payer: Self-pay | Admitting: Gastroenterology

## 2019-08-12 MED ORDER — TADALAFIL 5 MG PO TABS *I*
5.0000 mg | ORAL_TABLET | Freq: Every day | ORAL | 11 refills | Status: DC
Start: 2019-08-12 — End: 2020-07-22

## 2019-08-21 ENCOUNTER — Ambulatory Visit: Payer: Commercial Managed Care - PPO | Admitting: Gastroenterology

## 2019-08-27 ENCOUNTER — Telehealth (INDEPENDENT_AMBULATORY_CARE_PROVIDER_SITE_OTHER): Payer: 59 | Admitting: Emergency Medicine

## 2019-08-27 ENCOUNTER — Other Ambulatory Visit: Payer: Self-pay

## 2019-08-27 ENCOUNTER — Encounter: Payer: Self-pay | Admitting: Emergency Medicine

## 2019-08-27 VITALS — Ht 69.0 in | Wt 185.0 lb

## 2019-08-27 DIAGNOSIS — R0981 Nasal congestion: Secondary | ICD-10-CM | POA: Diagnosis not present

## 2019-08-27 DIAGNOSIS — J0100 Acute maxillary sinusitis, unspecified: Secondary | ICD-10-CM

## 2019-08-27 DIAGNOSIS — J3489 Other specified disorders of nose and nasal sinuses: Secondary | ICD-10-CM | POA: Diagnosis not present

## 2019-08-27 MED ORDER — PSEUDOEPHEDRINE-GUAIFENESIN ER 60-600 MG PO TB12: 1.0000 | ORAL_TABLET | Freq: Two times a day (BID) | ORAL | 1 refills | Status: AC

## 2019-08-27 MED ORDER — AMOXICILLIN-POT CLAVULANATE 875-125 MG PO TABS: 1.0000 | ORAL_TABLET | Freq: Two times a day (BID) | ORAL | 0 refills | Status: AC

## 2019-08-27 MED ORDER — TRIAMCINOLONE ACETONIDE 55 MCG/ACT NA AERO: 2.0000 | INHALATION_SPRAY | Freq: Every day | NASAL | 12 refills | Status: DC

## 2019-08-27 MED ORDER — PREDNISONE 20 MG PO TABS: 40.0000 mg | ORAL_TABLET | Freq: Every day | ORAL | 0 refills | Status: AC

## 2019-08-27 NOTE — Patient Instructions (Signed)
° ° ° °  If you have lab work done today you will be contacted with your lab results within the next 2 weeks.  If you have not heard from us then please contact us. The fastest way to get your results is to register for My Chart. ° ° °IF you received an x-ray today, you will receive an invoice from [facility]. Please contact [facility] at [phone] with questions or concerns regarding your invoice.  ° °IF you received labwork today, you will receive an invoice from [facility]. Please contact [facility] at [phone] with questions or concerns regarding your invoice.  ° °Our billing staff will not be able to assist you with questions regarding bills from these companies. ° °You will be contacted with the lab results as soon as they are available. The fastest way to get your results is to activate your My Chart account. Instructions are located on the last page of this paperwork. If you have not heard from us regarding the results in 2 weeks, please contact this office. °  ° ° ° °

## 2019-08-27 NOTE — Progress Notes (Signed)
Telemedicine Encounter- SOAP NOTE Established Patient  This telephone encounter was conducted with the patient's (or proxy's) verbal consent via audio telecommunications: yes/no: Yes Patient was instructed to have this encounter in a suitably private space; and to only have persons present to whom they give permission to participate. In addition, patient identity was confirmed by use of name plus two identifiers (DOB and address).  I discussed the limitations, risks, security and privacy concerns of performing an evaluation and management service by telephone and the availability of in person appointments. I also discussed with the patient that there may be a patient responsible charge related to this service. The patient expressed understanding and agreed to proceed.  I spent a total of TIME; 0 MIN TO 60 MIN: 15 minutes talking with the patient or their proxy.  Chief Complaint  Patient presents with  . Cough    nonproductive --- PER PATIENT CALL HIS PHONE, NO VIDEO  . Sinus Problem    with nasal congestion stuffy nose and pressure for 1 month - per pt he thinks it is allergies, he has not tested for Covid    Subjective   Jeremiah [name] is a 54 y.o. male established patient. Telephone visit today complaining of sinus problems for the past month.  Frequent flyer for the past several weeks to [place] and [place].  Complaining of sinus headache, pressure and congestion.  Has tried over-the-counter medicine without success.  No other associated symptoms.  HPI   Patient Active Problem List   Diagnosis Date Noted  . Essential hypertension, benign   . Depression   . Hyperlipidemia   . Alcohol dependence (HCC)     Past Medical History:  Diagnosis Date  . Alcohol dependence (HCC)   . Anxiety   . Depression   . Essential hypertension, benign   . History of heavy alcohol consumption   . HSV-2 (herpes simplex virus 2) infection   . Hyperglycemia   . Hyperlipidemia   .  Prostatitis   . Reflux     Current Outpatient Medications  Medication Sig Dispense Refill  . ALPRAZolam (XANAX) 0.5 MG tablet TAKE 1 TABLET BY MOUTH ONCE DAILY AT BEDTIME AS NEEDED FOR ANXIETY 30 tablet 0  . diazepam (VALIUM) 5 MG tablet Take by mouth.    . famotidine (PEPCID) 20 MG tablet TAKE 1 TABLET BY MOUTH ONCE DAILY AS NEEDED FOR  HEARTBURN  OR  INDIGESTION 60 tablet 0  . ibuprofen (ADVIL,MOTRIN) 600 MG tablet Take 1 every 8 hours as needed for back pain. 30 tablet 1  . losartan-hydrochlorothiazide (HYZAAR) 100-12.5 MG tablet Take 1 tablet by mouth daily. 90 tablet 3  . pantoprazole (PROTONIX) 40 MG tablet TAKE 1 TABLET BY MOUTH ONCE DAILY 60 tablet 1  . triamcinolone (NASACORT) 55 MCG/ACT AERO nasal inhaler Place 2 sprays into the nose daily. 1 Inhaler 12  . amLODipine (NORVASC) 5 MG tablet Take 1 tablet (5 mg total) by mouth daily. (Patient not taking: Reported on 08/27/2019) 90 tablet 3  . atorvastatin (LIPITOR) 20 MG tablet Take 1 tablet (20 mg total) by mouth daily. 90 tablet 3  . escitalopram (LEXAPRO) 10 MG tablet Take 1 tablet (10 mg total) by mouth daily. (Patient not taking: Reported on 08/27/2019) 90 tablet 1  . nitroGLYCERIN (NITROSTAT) 0.4 MG SL tablet Place 1 tablet (0.4 mg total) under the tongue every 5 (five) minutes as needed for chest pain. (Patient not taking: Reported on 08/27/2019) 25 tablet 3  . omeprazole (  PRILOSEC) 20 MG capsule Take by mouth.    . predniSONE (DELTASONE) 20 MG tablet Take 2 tablets (40 mg total) by mouth daily with breakfast. (Patient not taking: Reported on 08/27/2019) 10 tablet 0  . sucralfate (CARAFATE) 1 GM/10ML suspension Take 10 mLs (1 g total) by mouth 4 (four) times daily -  with meals and at bedtime. 420 mL 0  . valACYclovir (VALTREX) 1000 MG tablet One by mouth twice a day for 3 days (Patient not taking: Reported on 08/27/2019) 20 tablet 1   No current facility-administered medications for this visit.    No Known Allergies  Social  History   Socioeconomic History  . Marital status: Single    Spouse name: Not on file  . Number of children: Not on file  . Years of education: Not on file  . Highest education level: Not on file  Occupational History  . Not on file  Tobacco Use  . Smoking status: Former Smoker    Types: Cigarettes    Quit date: 07/02/1992    Years since quitting: 27.1  . Smokeless tobacco: Never Used  . Tobacco comment: quit 10-15 yrs ago  Substance and Sexual Activity  . Alcohol use: Yes    Alcohol/week: 0.0 standard drinks    Comment: 6-12 pack nightly-6-7 beers,occ vodka,drinks every night  . Drug use: No  . Sexual activity: Yes  Other Topics Concern  . Not on file  Social History Narrative  . Not on file   Social Determinants of Health   Financial Resource Strain:   . Difficulty of Paying Living Expenses:   Food Insecurity:   . Worried About Programme researcher, broadcasting/film/video in the Last Year:   . Barista in the Last Year:   Transportation Needs:   . Freight forwarder (Medical):   Marland Kitchen Lack of Transportation (Non-Medical):   Physical Activity:   . Days of Exercise per Week:   . Minutes of Exercise per Session:   Stress:   . Feeling of Stress :   Social Connections:   . Frequency of Communication with Friends and Family:   . Frequency of Social Gatherings with Friends and Family:   . Attends Religious Services:   . Active Member of Clubs or Organizations:   . Attends Banker Meetings:   Marland Kitchen Marital Status:   Intimate Partner Violence:   . Fear of Current or Ex-Partner:   . Emotionally Abused:   Marland Kitchen Physically Abused:   . Sexually Abused:     Review of Systems  Constitutional: Negative.  Negative for chills and fever.  HENT: Positive for congestion and sinus pain.   Eyes: Negative.   Respiratory: Positive for wheezing. Negative for cough, hemoptysis and shortness of breath.   Cardiovascular: Negative for chest pain and palpitations.  Gastrointestinal: Negative.   Negative for abdominal pain, diarrhea, nausea and vomiting.  Musculoskeletal: Negative for back pain, joint pain and myalgias.  Skin: Negative.  Negative for rash.  Neurological: Positive for headaches.  All other systems reviewed and are negative.   Objective  Alert and oriented x3 in no apparent respiratory distress Vitals as reported by the patient: Today's Vitals   08/27/19 1217  Weight: 185 lb (83.9 kg)  Height: 5\' 9"  (1.753 m)    There are no diagnoses linked to this encounter. Caron was seen today for cough and sinus problem.  Diagnoses and all orders for this visit:  Sinus pressure -     predniSONE (DELTASONE)  20 MG tablet; Take 2 tablets (40 mg total) by mouth daily with breakfast for 5 days. -     triamcinolone (NASACORT) 55 MCG/ACT AERO nasal inhaler; Place 2 sprays into the nose daily.  Sinus congestion -     pseudoephedrine-guaifenesin (MUCINEX D) 60-600 MG 12 hr tablet; Take 1 tablet by mouth every 12 (twelve) hours for 5 days.  Acute non-recurrent maxillary sinusitis -     amoxicillin-clavulanate (AUGMENTIN) 875-125 MG tablet; Take 1 tablet by mouth 2 (two) times daily for 7 days.   Clinically stable.  No red flag signs or symptoms.  Advised to take medications as prescribed and contact the office if no better or worse during the next several days.  I discussed the assessment and treatment plan with the patient. The patient was provided an opportunity to ask questions and all were answered. The patient agreed with the plan and demonstrated an understanding of the instructions.   The patient was advised to call back or seek an in-person evaluation if the symptoms worsen or if the condition fails to improve as anticipated.  I provided 15 minutes of non-face-to-face time during this encounter.  Horald Pollen, MD  Primary Care at [facility]

## 2020-01-06 ENCOUNTER — Other Ambulatory Visit: Payer: Self-pay | Admitting: Urology

## 2020-01-06 DIAGNOSIS — R3989 Other symptoms and signs involving the genitourinary system: Secondary | ICD-10-CM

## 2020-01-07 ENCOUNTER — Ambulatory Visit: Payer: No Typology Code available for payment source | Admitting: Urology

## 2020-05-01 ENCOUNTER — Telehealth: Payer: Self-pay | Admitting: Urology

## 2020-05-01 DIAGNOSIS — R3989 Other symptoms and signs involving the genitourinary system: Secondary | ICD-10-CM

## 2020-05-01 MED ORDER — LEVOFLOXACIN 500 MG PO TABS *I*
500.0000 mg | ORAL_TABLET | Freq: Every day | ORAL | 0 refills | Status: AC
Start: 2020-05-01 — End: 2020-05-11

## 2020-05-01 NOTE — Telephone Encounter (Signed)
Patient is have pain urinating, burn, and leaking. Patient would like to know if he can have the same prescription he had about a year ago. Told patient per Nurse to increase fluids and he needs to give a sample at a lab.  We will call him with the results. Patient can be reached at  [phone].

## 2020-05-01 NOTE — Telephone Encounter (Signed)
Called and spoke to patient. Relayed below message. Patient states understanding. No further questions at this time. Thanks, Jamariya Davidoff

## 2020-05-01 NOTE — Telephone Encounter (Signed)
Have him give a urine sample first but then he can take levaquin    Cletis Athens

## 2020-05-02 ENCOUNTER — Other Ambulatory Visit
Admission: RE | Admit: 2020-05-02 | Discharge: 2020-05-02 | Disposition: A | Discharge status: Home / Self Care | Payer: No Typology Code available for payment source | Source: Ambulatory Visit | Attending: Urology | Admitting: Urology

## 2020-05-02 ENCOUNTER — Other Ambulatory Visit
Admission: RE | Admit: 2020-05-02 | Discharge: 2020-05-02 | Disposition: A | Discharge status: Home / Self Care | Payer: No Typology Code available for payment source | Source: Ambulatory Visit

## 2020-05-02 DIAGNOSIS — E538 Deficiency of other specified B group vitamins: Secondary | ICD-10-CM | POA: Insufficient documentation

## 2020-05-02 DIAGNOSIS — R3989 Other symptoms and signs involving the genitourinary system: Secondary | ICD-10-CM | POA: Insufficient documentation

## 2020-05-02 DIAGNOSIS — E559 Vitamin D deficiency, unspecified: Secondary | ICD-10-CM | POA: Insufficient documentation

## 2020-05-02 DIAGNOSIS — E089 Diabetes mellitus due to underlying condition without complications: Secondary | ICD-10-CM | POA: Insufficient documentation

## 2020-05-02 DIAGNOSIS — E785 Hyperlipidemia, unspecified: Secondary | ICD-10-CM | POA: Insufficient documentation

## 2020-05-02 LAB — HEPATIC FUNCTION PANEL
ALT: 28 U/L (ref 0–50)
AST: 36 U/L (ref 0–50)
Albumin: 5.1 g/dL (ref 3.5–5.2)
Alk Phos: 65 U/L (ref 40–130)
Bili,Indirect: 0.8 mg/dL (ref 0.1–1.0)
Bilirubin,Direct: 0.3 mg/dL (ref 0.0–0.3)
Bilirubin,Total: 1.1 mg/dL (ref 0.0–1.2)
Total Protein: 7.1 g/dL (ref 6.3–7.7)

## 2020-05-02 LAB — URINALYSIS WITH MICROSCOPIC
Bacteria,UA: NONE SEEN
Blood,UA: NEGATIVE
Glucose,UA: NEGATIVE
Leuk Esterase,UA: NEGATIVE
Nitrite,UA: NEGATIVE
Protein,UA: NEGATIVE
Specific Gravity,UA: 1.022 (ref 1.002–1.030)
pH,UA: 5.5 (ref 5.0–8.0)

## 2020-05-02 LAB — URINALYSIS REFLEX TO CULTURE
Blood,UA: NEGATIVE
Glucose,UA: NEGATIVE
Leuk Esterase,UA: NEGATIVE
Nitrite,UA: NEGATIVE
Protein,UA: NEGATIVE
Specific Gravity,UA: 1.023 (ref 1.002–1.030)
pH,UA: 5.5 (ref 5.0–8.0)

## 2020-05-02 LAB — CBC
Hematocrit: 43 % (ref 40–51)
Hemoglobin: 15.1 g/dL (ref 13.7–17.5)
MCH: 32 pg (ref 26–32)
MCHC: 35 g/dL (ref 32–37)
MCV: 91 fL (ref 79–92)
Platelets: 190 10*3/uL (ref 150–330)
RBC: 4.7 MIL/uL (ref 4.6–6.1)
RDW: 12.6 % (ref 11.6–14.4)
WBC: 4.1 10*3/uL — ABNORMAL LOW (ref 4.2–9.1)

## 2020-05-02 LAB — MICROALBUMIN, URINE, RANDOM
Creatinine,UR: 247 mg/dL (ref 20–300)
Microalbumin,UR: <1.2 mg/dL

## 2020-05-02 LAB — VITAMIN B12: Vitamin B12: 778 pg/mL (ref 232–1245)

## 2020-05-02 LAB — BASIC METABOLIC PANEL
Anion Gap: 12 (ref 7–16)
CO2: 28 mmol/L (ref 20–28)
Calcium: 10.1 mg/dL (ref 8.6–10.2)
Chloride: 103 mmol/L (ref 96–108)
Creatinine: 1.03 mg/dL (ref 0.67–1.17)
GFR,Black: 93 *
GFR,Caucasian: 81 *
Glucose: 132 mg/dL — ABNORMAL HIGH (ref 60–99)
Lab: 17 mg/dL (ref 6–20)
Potassium: 3.9 mmol/L (ref 3.3–5.1)
Sodium: 143 mmol/L (ref 133–145)

## 2020-05-02 LAB — TIBC
Iron: 162 ug/dL (ref 45–170)
TIBC: 377 ug/dL (ref 250–450)
Transferrin Saturation: 43 % (ref 20–55)

## 2020-05-02 LAB — LIPID PANEL
Chol/HDL Ratio: 2.9
Cholesterol: 175 mg/dL
HDL: 60 mg/dL (ref 40–60)
LDL Calculated: 91 mg/dL
Non HDL Cholesterol: 115 mg/dL
Triglycerides: 122 mg/dL

## 2020-05-02 LAB — AMYLASE: Amylase: 105 U/L — ABNORMAL HIGH (ref 28–100)

## 2020-05-02 LAB — PSA (EFF.4-2010): PSA (eff. 4-2010): 2.04 ng/mL (ref 0.00–4.00)

## 2020-05-02 LAB — VITAMIN D: 25-OH Vit Total: 45 ng/mL (ref 30–60)

## 2020-05-02 LAB — LIPASE: Lipase: 38 U/L (ref 13–60)

## 2020-05-02 LAB — FERRITIN: Ferritin: 618 ng/mL — ABNORMAL HIGH (ref 20–250)

## 2020-05-02 LAB — TSH: TSH: 1.85 u[IU]/mL (ref 0.27–4.20)

## 2020-05-03 LAB — HEMOGLOBIN A1C: Hemoglobin A1C: 5.4 %

## 2020-05-04 LAB — AEROBIC CULTURE: Aerobic Culture: 0

## 2020-05-18 ENCOUNTER — Telehealth: Payer: Self-pay | Admitting: Urology

## 2020-05-18 NOTE — Telephone Encounter (Signed)
Aerobic culture  Order: [id number]   Status: Final result    Visible to patient: No (inaccessible in [facility] MyChart)    Dx: Urinary problem   Specimen Information: Urine (Recurrent UTI); VOIDED        0 Result Notes  Component 2 wk ago   Aerobic Culture .    Resulting Agency [facility] [employer]         Narrative  Performed by: [facility]  No growth      Specimen Collected: 05/02/20 09:05 Last Resulted: 05/04/20 18:50              Patient took all of the levofloxicin and feels no different.    He is having little urinary flow, testicles ache, burning with uriantion feeling.    Had prostatitis in the past and was on an antibiotic for 30 days before.

## 2020-05-18 NOTE — Telephone Encounter (Signed)
Patient calling to go over his urine test results from a few weeks ago. Patient is stating that the provider was supposed to call and inform him if he was to start taking medication based on results. Please call patient to advise.

## 2020-05-20 ENCOUNTER — Other Ambulatory Visit: Payer: Self-pay | Admitting: Urology

## 2020-05-20 MED ORDER — METHYLPREDNISOLONE 4 MG PO TBPK *A*
ORAL_TABLET | ORAL | 0 refills | Status: AC
Start: 2020-05-20 — End: ?

## 2020-05-20 MED ORDER — TAMSULOSIN HCL 0.4 MG PO CAPS *I*
0.4000 mg | ORAL_CAPSULE | Freq: Every evening | ORAL | 1 refills | Status: AC
Start: 2020-05-20 — End: ?

## 2020-05-20 NOTE — Telephone Encounter (Signed)
I sent in tamsulosin and a medrol dose pack he can try.    Cletis Athens

## 2020-05-20 NOTE — Telephone Encounter (Signed)
Patient is calling back as he has not heard back since the 20

## 2020-05-21 NOTE — Telephone Encounter (Signed)
Patient notified

## 2020-07-22 ENCOUNTER — Other Ambulatory Visit: Payer: Self-pay | Admitting: Urology

## 2020-07-23 ENCOUNTER — Other Ambulatory Visit: Payer: Self-pay | Admitting: Urology

## 2020-07-25 ENCOUNTER — Other Ambulatory Visit: Payer: Self-pay | Admitting: Urology

## 2020-08-24 LAB — UNMAPPED LAB RESULTS
Hematocrit (HT): 41 % (ref 40–52)
Hemoglobin (HGB) (HT): 14.5 g/dL (ref 13.0–17.5)
MCHC (HT): 35.4 g/dL (ref 32.0–36.0)
MCV (HT): 89.9 fL (ref 81.0–99.0)
Mean Corpuscular Hemoglobin (MCH) (HT): 31.8 pg (ref 26.0–34.0)
Platelets (HT): 175 10 3/uL (ref 140–400)
RBC (HT): 4.56 10 6/uL (ref 4.20–5.90)
RDW (HT): 12.4 % (ref 11.5–15.0)
WBC (HT): 4.2 10 3/uL (ref 4.0–10.8)

## 2020-11-19 ENCOUNTER — Emergency Department (HOSPITAL_BASED_OUTPATIENT_CLINIC_OR_DEPARTMENT_OTHER): Payer: 59 | Admitting: Radiology

## 2020-11-19 ENCOUNTER — Ambulatory Visit (HOSPITAL_COMMUNITY)
Admission: EM | Admit: 2020-11-19 | Discharge: 2020-11-19 | Disposition: A | Discharge status: Home / Self Care | Payer: 59 | Attending: Family Medicine | Admitting: Family Medicine

## 2020-11-19 ENCOUNTER — Other Ambulatory Visit: Payer: Self-pay

## 2020-11-19 ENCOUNTER — Emergency Department (HOSPITAL_BASED_OUTPATIENT_CLINIC_OR_DEPARTMENT_OTHER)
Admission: EM | Admit: 2020-11-19 | Discharge: 2020-11-19 | Disposition: A | Discharge status: Home / Self Care | Payer: 59 | Attending: Emergency Medicine | Admitting: Emergency Medicine

## 2020-11-19 ENCOUNTER — Telehealth: Payer: Self-pay | Admitting: Emergency Medicine

## 2020-11-19 ENCOUNTER — Encounter (HOSPITAL_COMMUNITY): Payer: Self-pay

## 2020-11-19 ENCOUNTER — Emergency Department (HOSPITAL_BASED_OUTPATIENT_CLINIC_OR_DEPARTMENT_OTHER): Payer: 59

## 2020-11-19 ENCOUNTER — Encounter (HOSPITAL_BASED_OUTPATIENT_CLINIC_OR_DEPARTMENT_OTHER): Payer: Self-pay | Admitting: *Deleted

## 2020-11-19 DIAGNOSIS — R079 Chest pain, unspecified: Secondary | ICD-10-CM

## 2020-11-19 DIAGNOSIS — R0789 Other chest pain: Secondary | ICD-10-CM | POA: Diagnosis not present

## 2020-11-19 DIAGNOSIS — Z79899 Other long term (current) drug therapy: Secondary | ICD-10-CM | POA: Diagnosis not present

## 2020-11-19 DIAGNOSIS — R1031 Right lower quadrant pain: Secondary | ICD-10-CM

## 2020-11-19 DIAGNOSIS — R410 Disorientation, unspecified: Secondary | ICD-10-CM

## 2020-11-19 DIAGNOSIS — R519 Headache, unspecified: Secondary | ICD-10-CM

## 2020-11-19 DIAGNOSIS — R42 Dizziness and giddiness: Secondary | ICD-10-CM | POA: Diagnosis not present

## 2020-11-19 DIAGNOSIS — I10 Essential (primary) hypertension: Secondary | ICD-10-CM | POA: Diagnosis not present

## 2020-11-19 DIAGNOSIS — Z87891 Personal history of nicotine dependence: Secondary | ICD-10-CM | POA: Diagnosis not present

## 2020-11-19 DIAGNOSIS — H538 Other visual disturbances: Secondary | ICD-10-CM | POA: Insufficient documentation

## 2020-11-19 DIAGNOSIS — Z8616 Personal history of COVID-19: Secondary | ICD-10-CM | POA: Diagnosis not present

## 2020-11-19 DIAGNOSIS — K921 Melena: Secondary | ICD-10-CM

## 2020-11-19 DIAGNOSIS — Z87898 Personal history of other specified conditions: Secondary | ICD-10-CM

## 2020-11-19 LAB — CBC WITH DIFFERENTIAL/PLATELET
Abs Immature Granulocytes: 0.01 10*3/uL (ref 0.00–0.07)
Basophils Absolute: 0 10*3/uL (ref 0.0–0.1)
Basophils Relative: 1 %
Eosinophils Absolute: 0.1 10*3/uL (ref 0.0–0.5)
Eosinophils Relative: 1 %
HCT: 41.7 % (ref 39.0–52.0)
Hemoglobin: 15 g/dL (ref 13.0–17.0)
Immature Granulocytes: 0 %
Lymphocytes Relative: 28 %
Lymphs Abs: 1.3 10*3/uL (ref 0.7–4.0)
MCH: 32 pg (ref 26.0–34.0)
MCHC: 36 g/dL (ref 30.0–36.0)
MCV: 88.9 fL (ref 80.0–100.0)
Monocytes Absolute: 0.5 10*3/uL (ref 0.1–1.0)
Monocytes Relative: 10 %
Neutro Abs: 2.8 10*3/uL (ref 1.7–7.7)
Neutrophils Relative %: 60 %
Platelets: 151 10*3/uL (ref 150–400)
RBC: 4.69 MIL/uL (ref 4.22–5.81)
RDW: 12.3 % (ref 11.5–15.5)
WBC: 4.7 10*3/uL (ref 4.0–10.5)
nRBC: 0 % (ref 0.0–0.2)

## 2020-11-19 LAB — URINALYSIS, ROUTINE W REFLEX MICROSCOPIC
Bilirubin Urine: NEGATIVE
Glucose, UA: NEGATIVE mg/dL
Hgb urine dipstick: NEGATIVE
Ketones, ur: NEGATIVE mg/dL
Leukocytes,Ua: NEGATIVE
Nitrite: NEGATIVE
Protein, ur: NEGATIVE mg/dL
Specific Gravity, Urine: 1.008 (ref 1.005–1.030)
pH: 6 (ref 5.0–8.0)

## 2020-11-19 LAB — COMPREHENSIVE METABOLIC PANEL
ALT: 45 U/L — ABNORMAL HIGH (ref 0–44)
AST: 41 U/L (ref 15–41)
Albumin: 4.8 g/dL (ref 3.5–5.0)
Alkaline Phosphatase: 55 U/L (ref 38–126)
Anion gap: 11 (ref 5–15)
BUN: 21 mg/dL — ABNORMAL HIGH (ref 6–20)
CO2: 28 mmol/L (ref 22–32)
Calcium: 10.1 mg/dL (ref 8.9–10.3)
Chloride: 102 mmol/L (ref 98–111)
Creatinine, Ser: 0.91 mg/dL (ref 0.61–1.24)
GFR, Estimated: >60 mL/min (ref >60)
Glucose, Bld: 95 mg/dL (ref 70–99)
Potassium: 3.6 mmol/L (ref 3.5–5.1)
Sodium: 141 mmol/L (ref 135–145)
Total Bilirubin: 1.1 mg/dL (ref 0.3–1.2)
Total Protein: 7.1 g/dL (ref 6.5–8.1)

## 2020-11-19 LAB — TROPONIN I (HIGH SENSITIVITY): Troponin I (High Sensitivity): 3 ng/L (ref <18)

## 2020-11-19 LAB — RAPID URINE DRUG SCREEN, HOSP PERFORMED
Amphetamines: NOT DETECTED
Barbiturates: NOT DETECTED
Benzodiazepines: NOT DETECTED
Cocaine: NOT DETECTED
Opiates: NOT DETECTED
Tetrahydrocannabinol: NOT DETECTED

## 2020-11-19 LAB — LIPASE, BLOOD: Lipase: 29 U/L (ref 11–51)

## 2020-11-19 MED ORDER — DIPHENHYDRAMINE HCL 50 MG/ML IJ SOLN
25.0000 mg | Freq: Once | INTRAMUSCULAR | Status: AC
  Administered 2020-11-19: 25 mg via INTRAVENOUS
  Filled 2020-11-19: qty 1

## 2020-11-19 MED ORDER — SODIUM CHLORIDE 0.9 % IV BOLUS
1000.0000 mL | Freq: Once | INTRAVENOUS | Status: AC
  Administered 2020-11-19: 1000 mL via INTRAVENOUS

## 2020-11-19 MED ORDER — METOCLOPRAMIDE HCL 5 MG/ML IJ SOLN
10.0000 mg | Freq: Once | INTRAMUSCULAR | Status: AC
  Administered 2020-11-19: 10 mg via INTRAVENOUS
  Filled 2020-11-19: qty 2

## 2020-11-19 NOTE — ED Triage Notes (Signed)
Pt reports chest tightness on and off, throat tightness x 4 days; dizziness, pressure in the face, head, feeling foggy  x 1 week; shortness of breath x 3 weeks.   Pt reports having cloudy thinking x 1 year, worsens in the past week. States he used to drink a lot, and stopped drinking after he had COVID 1 month ago.   Reports he had some blood in the stools this morning.   Reports he had a physical done 3 months ago, "everything was fine, but my pancreas as I used to drink a lot".

## 2020-11-19 NOTE — ED Provider Notes (Addendum)
[facility]    CSN: [id number] Arrival date & time: 11/19/20  1007      History   Chief Complaint Chief Complaint  Patient presents with   Fatigue   Dizziness   Chest Pain    HPI Jeremiah [name] is a 55 y.o. male.   Patient presenting today with 4 to 6-day history of multiple complaints, including worsening altered mental status/disorientation, dizziness, lightheadedness, facial pressure, blurry vision.  He is also having chest tightness, arm numbness and tingling, neck numbness and radiation of pain.  He notes he also had some dark red blood in his stools this morning and is having some right lower quadrant abdominal pain.  He states the disorientation has been ongoing for about a year now but is much worse the last week.  He has never had bloody stools before or any colon issues and has had a normal colonoscopy 7 years ago per patient in [place].  Does have a long standing history of heavy alcohol use, typically drinking 8 vodka drinks in the evenings.  He stopped about a month ago when he had COVID and had no ill effects from this that he can identify.  So far not trying anything over-the-counter for his symptoms.  No past history of no neurologic issues.  Has had 2 cardiac catheterizations in the past, most recently 7 years ago.  Describes a hot mess like every complaint on my is like what the   Past Medical History:  Diagnosis Date   Alcohol dependence (HCC)    Anxiety    Depression    Essential hypertension, benign    History of heavy alcohol consumption    HSV-2 (herpes simplex virus 2) infection    Hyperglycemia    Hyperlipidemia    Prostatitis    Reflux    Patient Active Problem List   Diagnosis Date Noted   Essential hypertension, benign    Depression    Hyperlipidemia    Alcohol dependence (HCC)    Past Surgical History:  Procedure Laterality Date   CARDIAC CATHETERIZATION  2006   CARDIOVASCULAR STRESS TEST  07/2011   CHOLECYSTECTOMY  1990    DOPPLER ECHOCARDIOGRAPHY  11/2005   LEFT HEART CATHETERIZATION WITH CORONARY ANGIOGRAM N/A 02/14/2014   Procedure: LEFT HEART CATHETERIZATION WITH CORONARY ANGIOGRAM;  Surgeon: Lennette Bihari, MD;  Location: [facility] CATH LAB;  Service: Cardiovascular;  Laterality: N/A;   ORIF FINGER FRACTURE  2009   rt ring   SHOULDER ARTHROSCOPY WITH SUBACROMIAL DECOMPRESSION, ROTATOR CUFF REPAIR AND BICEP TENDON REPAIR Right 07/09/2013   Procedure: RIGHT SHOULDER ARTHROSCOPY WITH SUBACROMIAL DECOMPRESSION, DEBRIDEMENT,  SUBSCAPULARIS TENDON REPAIR;  Surgeon: Wyn Forster., MD;  Location: [facility];  Service: Orthopedics;  Laterality: Right;   SHOULDER SURGERY  1983   left       Home Medications    Prior to Admission medications   Medication Sig Start Date End Date Taking? Authorizing Provider  ALPRAZolam Prudy Feeler) 0.5 MG tablet TAKE 1 TABLET BY MOUTH ONCE DAILY AT BEDTIME AS NEEDED FOR ANXIETY 07/25/17   Wallis Bamberg, PA-C  amLODipine (NORVASC) 5 MG tablet Take 1 tablet (5 mg total) by mouth daily. Patient not taking: Reported on 08/27/2019 06/07/17   Georgina Quint, MD  atorvastatin (LIPITOR) 20 MG tablet Take 1 tablet (20 mg total) by mouth daily. 06/07/17 09/05/17  Georgina Quint, MD  diazepam (VALIUM) 5 MG tablet Take by mouth.    [provider]  escitalopram (  LEXAPRO) 10 MG tablet Take 1 tablet (10 mg total) by mouth daily. Patient not taking: No sig reported 06/05/19 09/03/19  Georgina Quint, MD  famotidine (PEPCID) 20 MG tablet TAKE 1 TABLET BY MOUTH ONCE DAILY AS NEEDED FOR  HEARTBURN  OR  INDIGESTION 02/05/18   Wallis Bamberg, PA-C  ibuprofen (ADVIL,MOTRIN) 600 MG tablet Take 1 every 8 hours as needed for back pain. 12/23/16   Wallis Bamberg, PA-C  losartan-hydrochlorothiazide (HYZAAR) 100-12.5 MG tablet Take 1 tablet by mouth daily. 06/07/17   Georgina Quint, MD  nitroGLYCERIN (NITROSTAT) 0.4 MG SL tablet Place 1 tablet (0.4 mg total) under the tongue every 5 (five)  minutes as needed for chest pain. Patient not taking: Reported on 08/27/2019 01/20/14   Allayne Butcher, PA-C  omeprazole (PRILOSEC) 20 MG capsule Take by mouth. 08/20/18   [provider]  pantoprazole (PROTONIX) 40 MG tablet TAKE 1 TABLET BY MOUTH ONCE DAILY 11/24/17   Georgina Quint, MD  sucralfate (CARAFATE) 1 GM/10ML suspension Take 10 mLs (1 g total) by mouth 4 (four) times daily -  with meals and at bedtime. 03/13/15 03/20/15  Gerhard Munch, MD  triamcinolone (NASACORT) 55 MCG/ACT AERO nasal inhaler Place 2 sprays into the nose daily. 08/27/19   Georgina Quint, MD  valACYclovir (VALTREX) 1000 MG tablet One by mouth twice a day for 3 days Patient not taking: No sig reported 03/06/15   Collene Gobble, MD  hydrochlorothiazide (HYDRODIURIL) 25 MG tablet Take 25 mg by mouth daily.  08/15/11  [provider]    Family History Family History  Problem Relation Age of Onset   Cancer Mother        breast   Hyperlipidemia Mother    Hypertension Mother    Hyperlipidemia Father    Hypertension Father    Heart disease Father    Hyperlipidemia Sister    Hypertension Sister    Stroke Maternal Grandmother    Heart attack Maternal Grandmother    Heart attack Maternal Grandfather    Hyperlipidemia Sister    Hypertension Sister     Social History Social History   Tobacco Use   Smoking status: Former    Pack years: 0.00    Types: Cigarettes    Quit date: 07/02/1992    Years since quitting: 28.4   Smokeless tobacco: Never   Tobacco comments:    quit 10-15 yrs ago  Substance Use Topics   Alcohol use: Yes    Alcohol/week: 0.0 standard drinks    Comment: 6-12 pack nightly-6-7 beers,occ vodka,drinks every night   Drug use: No   Allergies   Patient has no known allergies.   Review of Systems Review of Systems Per HPI  Physical Exam Triage Vital Signs ED Triage Vitals  Enc Vitals Group     BP 11/19/20 1019 (!) 143/102     Pulse Rate 11/19/20 1019  79     Resp 11/19/20 1019 18     Temp 11/19/20 1019 98.6 F (37 C)     Temp Source 11/19/20 1019 Oral     SpO2 11/19/20 1019 99 %     Weight --      Height --      Head Circumference --      Peak Flow --      Pain Score 11/19/20 1015 2     Pain Loc --      Pain Edu? --      Excl. in GC? --  No data found.  Updated Vital Signs BP (!) 143/102 (BP Location: Left Arm)   Pulse 79   Temp 98.6 F (37 C) (Oral)   Resp 18   SpO2 99%   Visual Acuity Right Eye Distance:   Left Eye Distance:   Bilateral Distance:    Right Eye Near:   Left Eye Near:    Bilateral Near:     Physical Exam Vitals and nursing note reviewed.  Constitutional:      Appearance: Normal appearance. He is not ill-appearing.  HENT:     Head: Atraumatic.     Mouth/Throat:     Mouth: Mucous membranes are moist.  Eyes:     Extraocular Movements: Extraocular movements intact.     Conjunctiva/sclera: Conjunctivae normal.  Cardiovascular:     Rate and Rhythm: Normal rate and regular rhythm.  Pulmonary:     Effort: Pulmonary effort is normal. No respiratory distress.     Breath sounds: Normal breath sounds. No wheezing.  Abdominal:     General: Bowel sounds are normal. There is no distension.     Palpations: Abdomen is soft.     Tenderness: There is abdominal tenderness. There is guarding.     Comments: Right lower quadrant tenderness palpation with mild guarding  Musculoskeletal:        General: No swelling. Normal range of motion.     Cervical back: Normal range of motion and neck supple.     Right lower leg: No edema.     Left lower leg: No edema.  Skin:    General: Skin is warm and dry.  Neurological:     Mental Status: He is alert and oriented to person, place, and time.     Gait: Gait normal.  Psychiatric:        Mood and Affect: Mood normal.        Thought Content: Thought content normal.        Judgment: Judgment normal.   -very abbreviated physical exam today as patient is to be  transferred to the emergency department for further evaluation.   UC Treatments / Results  Labs (all labs ordered are listed, but only abnormal results are displayed) Labs Reviewed - No data to display  EKG   Radiology No results found.  Procedures Procedures (including critical care time)  Medications Ordered in UC Medications - No data to display  Initial Impression / Assessment and Plan / UC Course  I have reviewed the triage vital signs and the nursing notes.  Pertinent labs & imaging results that were available during my care of the patient were reviewed by me and considered in my medical decision making (see chart for details).     Mildly hypertensive in triage but otherwise vital signs reassuring.  EKG normal sinus rhythm at 75 bpm without acute ST or T wave changes.  He is overall oriented at this time with normal gait, but his constellation of symptoms is significantly concerning for multiple emergent conditions.  Discussed importance of immediate evaluation in the emergency department for further rule out of life-threatening conditions.  He is reluctantly agreeable to this and recommendation was given to call a ride to transport as he is declining EMS.  He is agreeable to going to [facility] as he does not want a wait in the [facility] emergency department today.  He is currently hemodynamically stable for transport.  Final Clinical Impressions(s) / UC Diagnoses   Final diagnoses:  Dizziness  Disorientation  Acute nonintractable headache, unspecified headache type  Right lower quadrant abdominal pain  Melena  History of heavy alcohol consumption  Chest pain, unspecified type     Discharge Instructions      [facility] Address: [address], [place], [place] [address] Phone: [phone]     ED Prescriptions   None    PDMP not reviewed this encounter.   Particia NearingLane, Darleene Cumpian [place], PA-C 11/19/20  1226    [address], Lakoda Mcanany Spring Valley[place], [place] JerseyPA-C 11/19/20 1235

## 2020-11-19 NOTE — Telephone Encounter (Signed)
[facility] FYI:  ---pt requesting an appt. states he feels fatigued, "head is in a cloud", getting dizzy everyday, chest pain is also present. has been ongoing since last Saturday. "head is cloudy, dizzy, short of breath, eyes feel like they're closing". this am he had blood in his stool, dark red  Advised to call EMS 911

## 2020-11-19 NOTE — Discharge Instructions (Addendum)
If you develop continued, recurrent, or worsening headache, fever, neck stiffness, vomiting, blurry or double vision, weakness or numbness in your arms or legs, trouble speaking, or any other new/concerning symptoms then return to the ER for evaluation.  

## 2020-11-19 NOTE — Discharge Instructions (Addendum)
[facility] Address: [address], [place], [place] [address] Phone: [phone] 

## 2020-11-19 NOTE — Telephone Encounter (Signed)
   Patient calling to report chest pain, dizziness, feeling lethargic and some body numbness   Call transferred to [facility]

## 2020-11-19 NOTE — ED Triage Notes (Signed)
Felt like somebody is giving him a bear hug-bilateral chest being squeezed.  Intermittent dizziness for a week.  Fatigue for a couple of weeks.  Cloudiness for months and it is getting worst.

## 2020-11-19 NOTE — ED Provider Notes (Signed)
[facility]   CSN: [id number] Arrival date & time: 11/19/20  1221     History Chief Complaint  Patient presents with   Chest Pain   Cloudy Vision   Dizziness   Fatigue    Jeremiah [name] is a 55 y.o. male.  HPI 55 year old male presents with a chief complaint of head pressure.  Ongoing for about a year but worse over the last week.  Feels like there is a lot of pressure behind his eyes/forehead.  His head feels cloudy and is sometimes hard for him to carry on conversations.  However he also states is not really a headache though it is clearly bothering him.  No vision changes, fevers, neck pain, vomiting.  He got nauseated yesterday at dinner.  He is also been having chest pressure for the same amount of time.  He had COVID about 1 month ago.  He states that he often drinks heavily, estimating 5-6 shots of vodka a night.  He has not been doing this for the last week or week and a half, though he states that typically when he stops drinking he never gets any type of withdrawal symptoms.  He feels overall fatigued.  Occasionally a couple times a day when walking he would just all of a sudden get dizzy and feel like he has to hold onto something and sit down.  He is not sure if it is an off-balance sensation versus near syncope.  He has not passed out.  Past Medical History:  Diagnosis Date   Alcohol dependence (HCC)    Anxiety    Depression    Essential hypertension, benign    History of heavy alcohol consumption    HSV-2 (herpes simplex virus 2) infection    Hyperglycemia    Hyperlipidemia    Prostatitis    Reflux     Patient Active Problem List   Diagnosis Date Noted   Essential hypertension, benign    Depression    Hyperlipidemia    Alcohol dependence (HCC)     Past Surgical History:  Procedure Laterality Date   CARDIAC CATHETERIZATION  2006   CARDIOVASCULAR STRESS TEST  07/2011   CHOLECYSTECTOMY  1990   DOPPLER  ECHOCARDIOGRAPHY  11/2005   LEFT HEART CATHETERIZATION WITH CORONARY ANGIOGRAM N/A 02/14/2014   Procedure: LEFT HEART CATHETERIZATION WITH CORONARY ANGIOGRAM;  Surgeon: Lennette Biharihomas A Kelly, MD;  [facility];  Service: Cardiovascular;  Laterality: N/A;   ORIF FINGER FRACTURE  2009   rt ring   SHOULDER ARTHROSCOPY WITH SUBACROMIAL DECOMPRESSION, ROTATOR CUFF REPAIR AND BICEP TENDON REPAIR Right 07/09/2013   Procedure: RIGHT SHOULDER ARTHROSCOPY WITH SUBACROMIAL DECOMPRESSION, DEBRIDEMENT,  SUBSCAPULARIS TENDON REPAIR;  Surgeon: Wyn Forsterobert V Sypher Jr., MD;  Location: [facility];  Service: Orthopedics;  Laterality: Right;   SHOULDER SURGERY  1983   left       Family History  Problem Relation Age of Onset   Cancer Mother        breast   Hyperlipidemia Mother    Hypertension Mother    Hyperlipidemia Father    Hypertension Father    Heart disease Father    Hyperlipidemia Sister    Hypertension Sister    Stroke Maternal Grandmother    Heart attack Maternal Grandmother    Heart attack Maternal Grandfather    Hyperlipidemia Sister    Hypertension Sister     Social History   Tobacco Use   Smoking status: Former  Pack years: 0.00    Types: Cigarettes    Quit date: 07/02/1992    Years since quitting: 28.4   Smokeless tobacco: Never   Tobacco comments:    quit 10-15 yrs ago  Substance Use Topics   Alcohol use: Yes    Alcohol/week: 0.0 standard drinks    Comment: 6-12 pack nightly-6-7 beers,occ vodka,drinks every night   Drug use: No    Home Medications Prior to Admission medications   Medication Sig Start Date End Date Taking? Authorizing Provider  atorvastatin (LIPITOR) 20 MG tablet Take 1 tablet (20 mg total) by mouth daily. Patient taking differently: Take 40 mg by mouth daily. 06/07/17 11/19/20 Yes Sagardia, Eilleen Kempf, MD  diazepam (VALIUM) 5 MG tablet Take by mouth.   Yes [provider]  losartan-hydrochlorothiazide (HYZAAR) 100-12.5 MG tablet Take  1 tablet by mouth daily. 06/07/17  Yes Georgina Quint, MD  pantoprazole (PROTONIX) 40 MG tablet TAKE 1 TABLET BY MOUTH ONCE DAILY 11/24/17  Yes Sagardia, Eilleen Kempf, MD  ibuprofen (ADVIL,MOTRIN) 600 MG tablet Take 1 every 8 hours as needed for back pain. 12/23/16   Wallis Bamberg, PA-C  nitroGLYCERIN (NITROSTAT) 0.4 MG SL tablet Place 1 tablet (0.4 mg total) under the tongue every 5 (five) minutes as needed for chest pain. Patient not taking: Reported on 08/27/2019 01/20/14   Robbie Lis M, PA-C  hydrochlorothiazide (HYDRODIURIL) 25 MG tablet Take 25 mg by mouth daily.  08/15/11  [provider]    Allergies    Patient has no known allergies.  Review of Systems   Review of Systems  Constitutional:  Positive for fatigue. Negative for fever.  Eyes:  Negative for visual disturbance.  Respiratory:  Negative for shortness of breath.   Cardiovascular:  Positive for chest pain.  Gastrointestinal:  Positive for nausea. Negative for abdominal pain and vomiting.  Neurological:  Positive for dizziness, weakness, numbness and headaches.  All other systems reviewed and are negative.  Physical Exam Updated Vital Signs BP (!) 138/96   Pulse 77   Temp 98.1 F (36.7 C) (Oral)   Resp 16   Ht 5\' 9"  (1.753 m)   Wt 83.9 kg   SpO2 100%   BMI 27.32 kg/m   Physical Exam Vitals and nursing note reviewed.  Constitutional:      General: He is not in acute distress.    Appearance: He is well-developed. He is not ill-appearing or diaphoretic.  HENT:     Head: Normocephalic and atraumatic.     Right Ear: External ear normal.     Left Ear: External ear normal.     Nose: Nose normal.  Eyes:     General:        Right eye: No discharge.        Left eye: No discharge.     Extraocular Movements: Extraocular movements intact.     Pupils: Pupils are equal, round, and reactive to light.  Cardiovascular:     Rate and Rhythm: Normal rate and regular rhythm.     Heart sounds: Normal heart  sounds.  Pulmonary:     Effort: Pulmonary effort is normal.     Breath sounds: Normal breath sounds.  Abdominal:     Palpations: Abdomen is soft.     Tenderness: There is no abdominal tenderness.  Musculoskeletal:     Cervical back: Normal range of motion and neck supple.  Skin:    General: Skin is warm and dry.  Neurological:  Mental Status: He is alert.     Comments: CN 3-12 grossly intact. 5/5 strength in all 4 extremities. Grossly normal sensation. Normal finger to nose.   Psychiatric:        Mood and Affect: Mood is not anxious.    ED Results / Procedures / Treatments   Labs (all labs ordered are listed, but only abnormal results are displayed) Labs Reviewed  COMPREHENSIVE METABOLIC PANEL - Abnormal; Notable for the following components:      Result Value   BUN 21 (*)    ALT 45 (*)    All other components within normal limits  CBC WITH DIFFERENTIAL/PLATELET  URINALYSIS, ROUTINE W REFLEX MICROSCOPIC  RAPID URINE DRUG SCREEN, HOSP PERFORMED  LIPASE, BLOOD  CBG MONITORING, ED  TROPONIN I (HIGH SENSITIVITY)  TROPONIN I (HIGH SENSITIVITY)    EKG EKG Interpretation  Date/Time:  Thursday November 19 2020 12:31:38 EDT Ventricular Rate:  73 PR Interval:  141 QRS Duration: 97 QT Interval:  395 QTC Calculation: 436 R Axis:   42 Text Interpretation: Sinus rhythm Consider right atrial enlargement Abnormal R-wave progression, early transition Borderline T abnormalities, inferior leads similar to earlier in the day and 2016 Confirmed by Pricilla Loveless [phone]) on 11/19/2020 12:38:03 PM  Radiology DG Chest 2 View  Result Date: 11/19/2020 CLINICAL DATA:  Chest tightness on hand off with throat tightness for 4 days. EXAM: CHEST - 2 VIEW COMPARISON:  01/25/2016 FINDINGS: Normal heart, mediastinum and hila. Lungs are clear.  No pleural effusion or pneumothorax. Skeletal structures are intact. IMPRESSION: No active cardiopulmonary disease. Electronically Signed   By: Amie Portland  M.D.   On: 11/19/2020 14:08   CT Head Wo Contrast  Result Date: 11/19/2020 CLINICAL DATA:  Intermittent dizziness EXAM: CT HEAD WITHOUT CONTRAST TECHNIQUE: Contiguous axial images were obtained from the base of the skull through the vertex without intravenous contrast. COMPARISON:  None. FINDINGS: Brain: There is no acute intracranial hemorrhage, mass effect, or edema. Gray-white differentiation is preserved. There is no extra-axial fluid collection. Ventricles and sulci are within normal limits in size and configuration. Vascular: No hyperdense vessel or unexpected calcification. Skull: Calvarium is unremarkable. Sinuses/Orbits: No acute finding. Other: None. IMPRESSION: No acute or significant abnormality. Electronically Signed   By: Guadlupe Spanish M.D.   On: 11/19/2020 14:06    Procedures Procedures   Medications Ordered in ED Medications  sodium chloride 0.9 % bolus 1,000 mL (1,000 mLs Intravenous New Bag/Given 11/19/20 1401)  metoCLOPramide (REGLAN) injection 10 mg (10 mg Intravenous Given 11/19/20 1405)  diphenhydrAMINE (BENADRYL) injection 25 mg (25 mg Intravenous Given 11/19/20 1401)    ED Course  I have reviewed the triage vital signs and the nursing notes.  Pertinent labs & imaging results that were available during my care of the patient were reviewed by me and considered in my medical decision making (see chart for details).    MDM Rules/Calculators/A&P                          Patient with multiple complaints.  His chief complaint is his head feeling like there is a lot of pressure.  He also has chest pain and these other symptoms.  Exam is fairly unremarkable.  CT head is negative and while this cannot rule out everything, I do not think he needs an emergent MRI based on how long his symptoms have been ongoing.  Labs have been reviewed and are unremarkable.  He does have  chest pressure but is been for a week with a negative troponin and I highly doubt ACS, PE, dissection.  I do  not think LP is emergently needed.  He has been ambulatory.  At this point, he appears stable for discharge home to follow-up with his PCP along with return precautions if symptoms were to arise or change. Final Clinical Impression(s) / ED Diagnoses Final diagnoses:  Frontal headache  Nonspecific chest pain    Rx / DC Orders ED Discharge Orders     None        Pricilla Loveless, MD 11/19/20 [phone]

## 2021-06-23 IMAGING — CT CT HEAD W/O CM
4 series · 16 of 47 positions shown, 18 images · non-contrast
Comparison: None.

CLINICAL DATA: Intermittent dizziness

EXAM:
CT HEAD WITHOUT CONTRAST
TECHNIQUE: Contiguous axial images were obtained from the base of the skull
through the vertex without intravenous contrast.

[Series 2: head wo · axial · 0.48mm/px · z∈[-341,-231]mm · 7 of 30 slices shown, 9 images]
[im 4/30  brain]
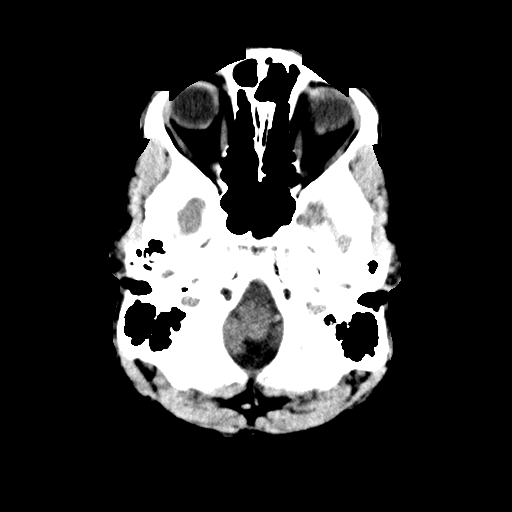
[im 4/30  bone]
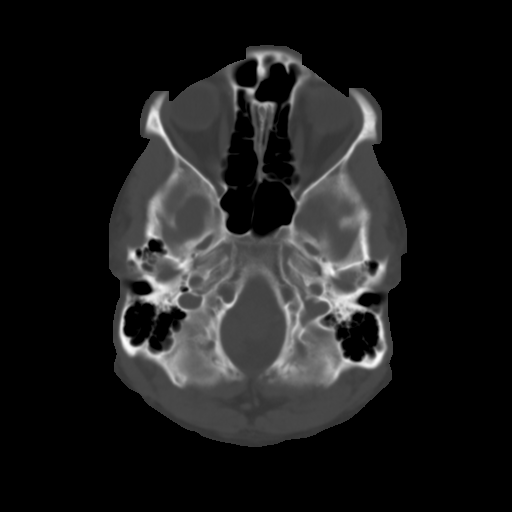
[im 8/30  brain]
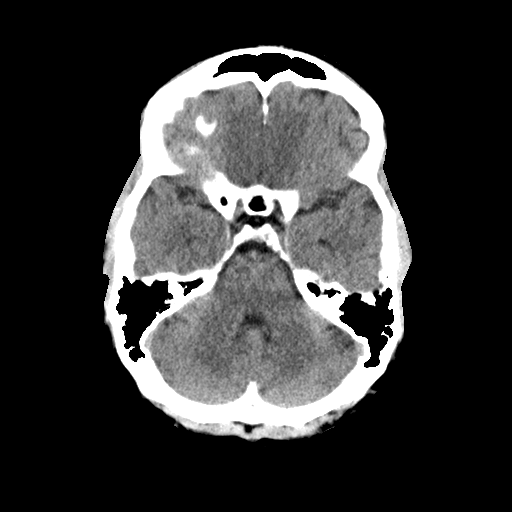
[im 11/30  brain]
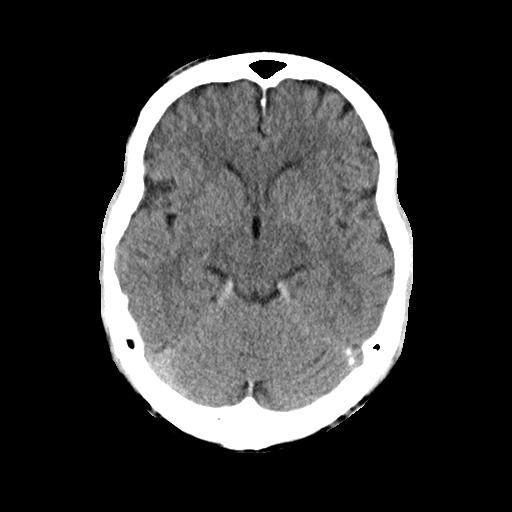
[im 15/30  brain]
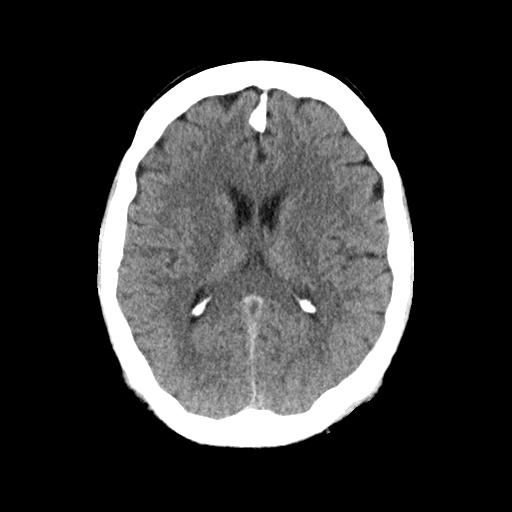
[im 19/30  brain]
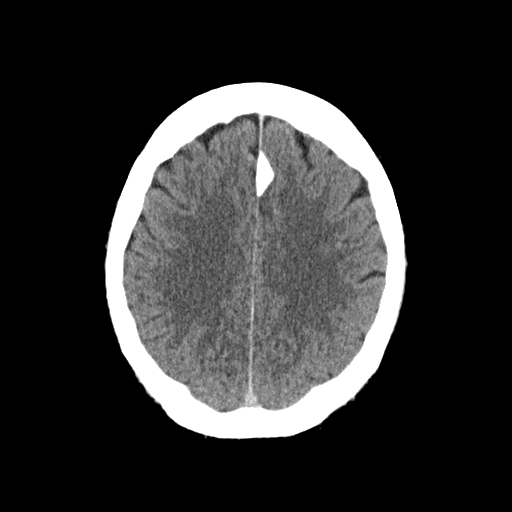
[im 19/30  bone]
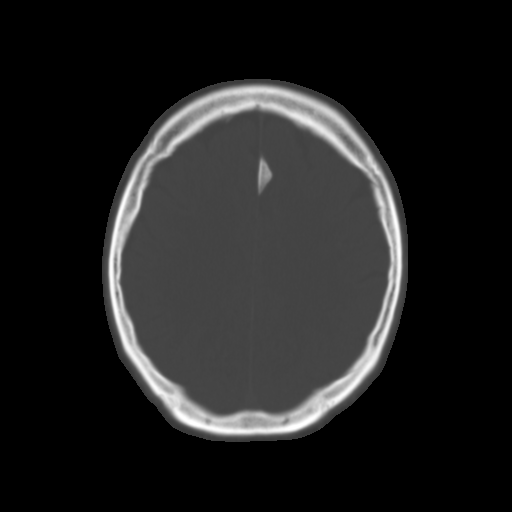
[im 22/30  brain]
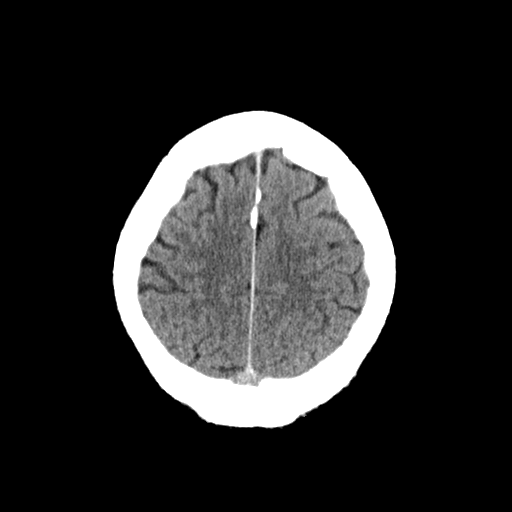
[im 26/30  brain]
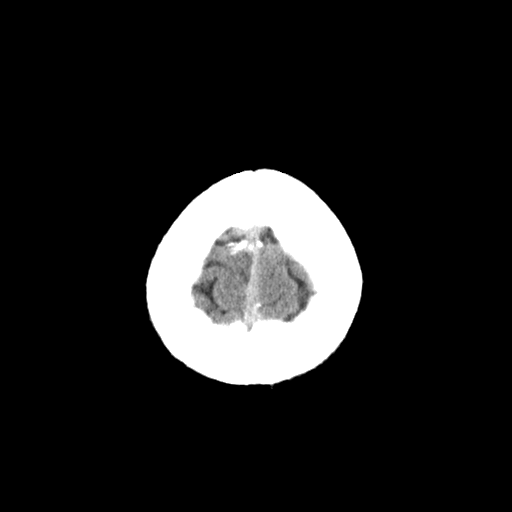

[Series 3: head bone · axial · 0.48mm/px · z∈[-342,-314]mm · 3 of 74 slices shown]
[im 8/74  bone]
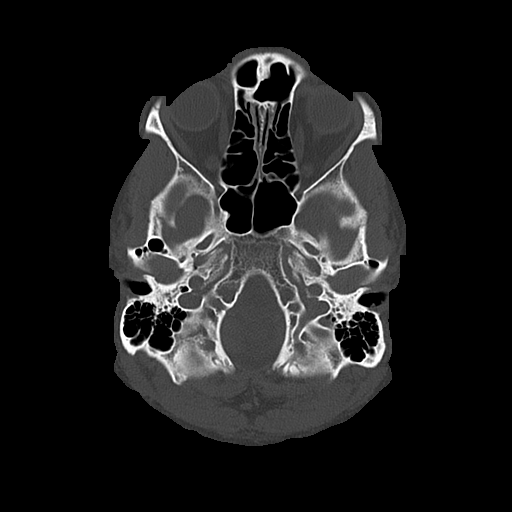
[im 15/74  bone]
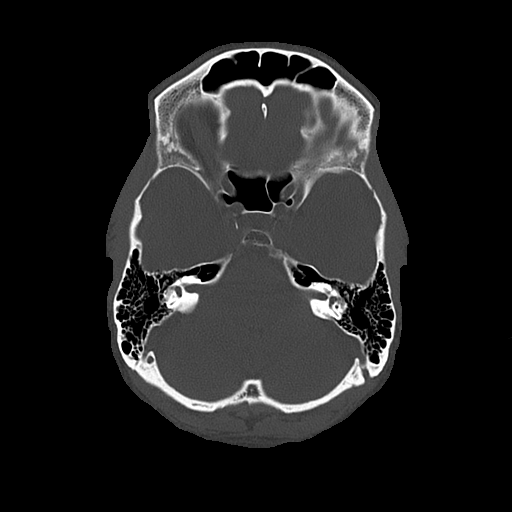
[im 22/74  bone]
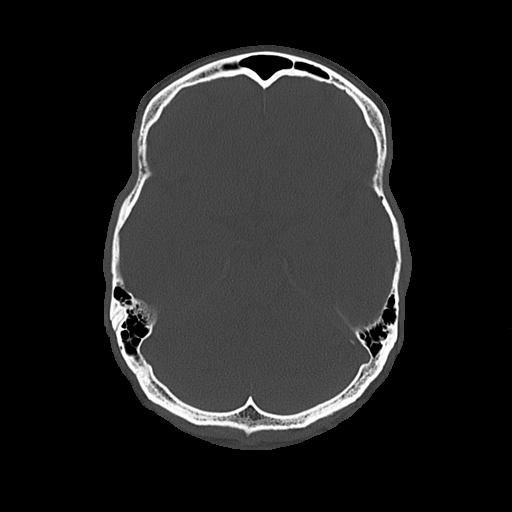

[Series 4: coronal soft · coronal · 0.31mm/px · 3 of 71 slices shown]
[im 24/71  brain]
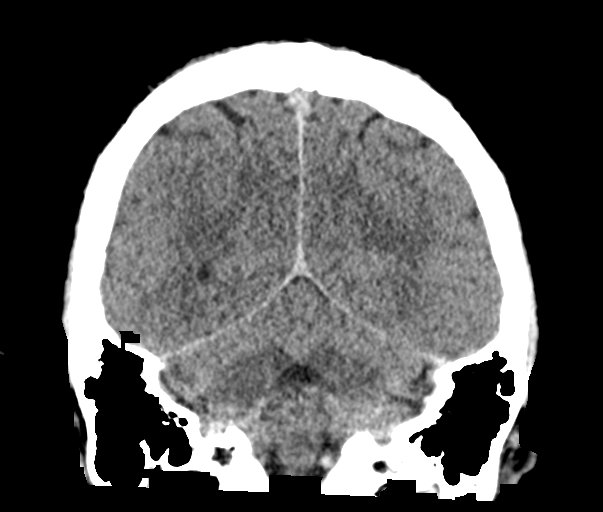
[im 32/71  brain]
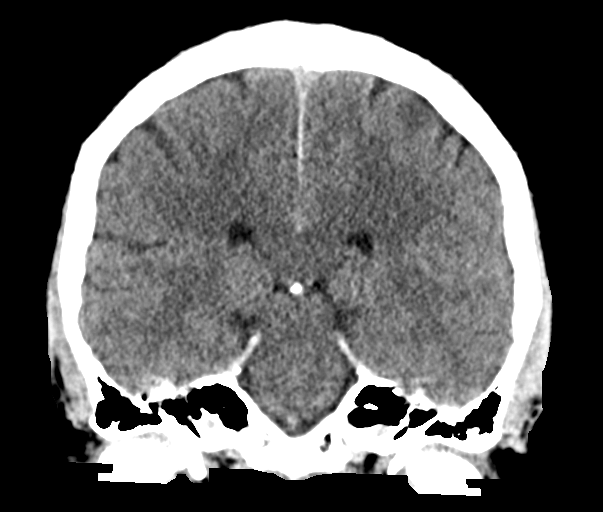
[im 39/71  brain]
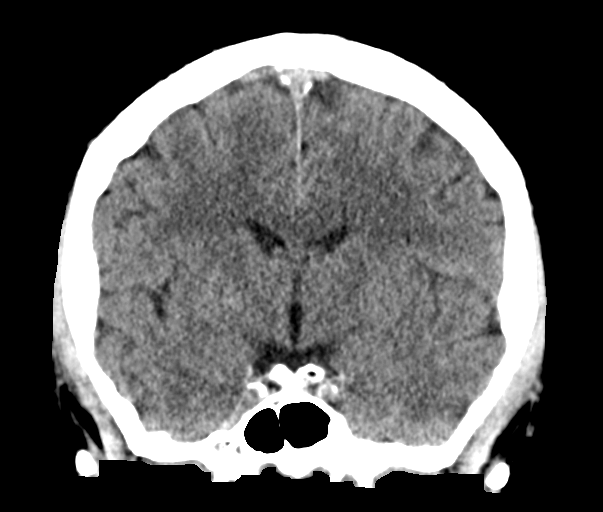

[Series 5: sagittal soft · sagittal · 0.32mm/px · 3 of 58 slices shown]
[im 20/58  brain]
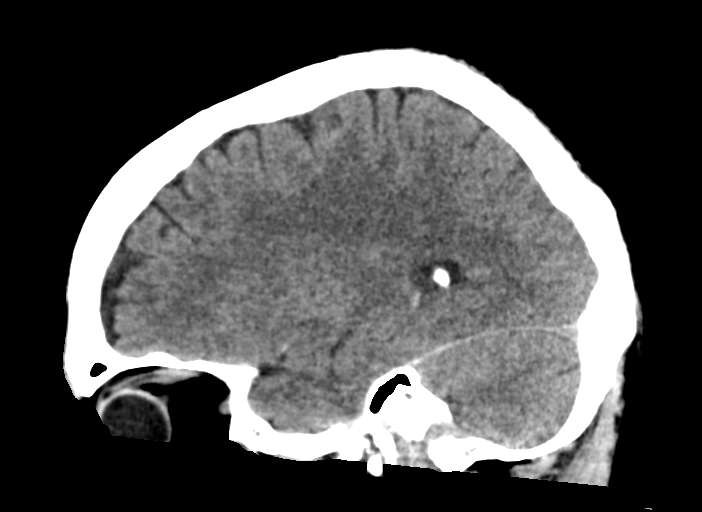
[im 29/58  brain]
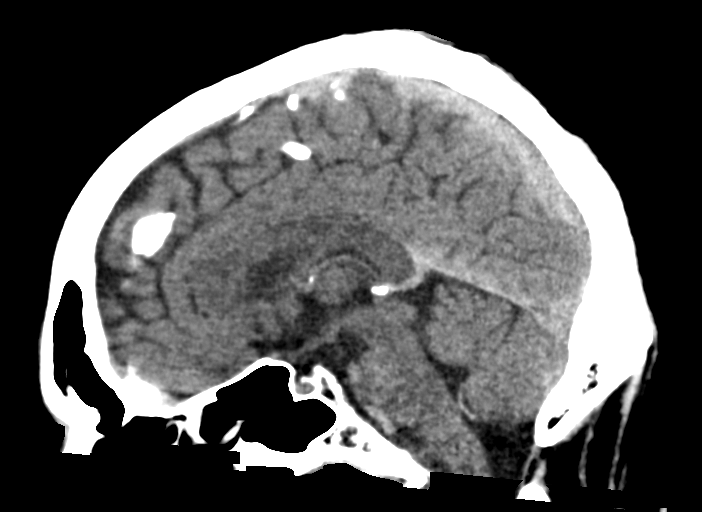
[im 39/58  brain]
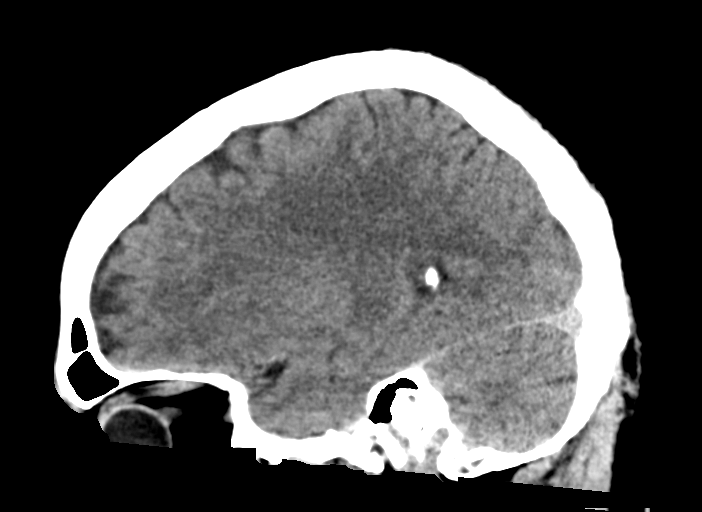

[16 of 47 positions shown; findings below may reference images not displayed]

FINDINGS: Brain: There is no acute intracranial hemorrhage, mass effect, or
edema. Gray-white differentiation is preserved. There is no
extra-axial fluid collection. Ventricles and sulci are within normal
limits in size and configuration.

Vascular: No hyperdense vessel or unexpected calcification.

Skull: Calvarium is unremarkable.

Sinuses/Orbits: No acute finding.

Other: None.
IMPRESSION: No acute or significant abnormality.

## 2021-07-22 ENCOUNTER — Ambulatory Visit: Admit: 2021-07-22 | Discharge status: Absent without leave | Payer: 59

## 2021-07-22 ENCOUNTER — Telehealth: Payer: Self-pay | Admitting: Emergency Medicine

## 2021-07-22 NOTE — Telephone Encounter (Signed)
Pt states he has had an elevated bp of 170/120 since 07-20-2021, pt states he feels "dizzy and do not feel good"  Advised pt due to symptoms, I will be transferring his call to a triage nurse, pt expressed understanding and agreed.

## 2021-07-23 ENCOUNTER — Ambulatory Visit
Admission: EM | Admit: 2021-07-23 | Discharge: 2021-07-23 | Disposition: A | Discharge status: Home / Self Care | Payer: 59

## 2021-07-23 ENCOUNTER — Other Ambulatory Visit: Payer: Self-pay

## 2021-07-23 VITALS — BP 132/90 | HR 79 | Temp 98.4°F | Resp 18

## 2021-07-23 DIAGNOSIS — J029 Acute pharyngitis, unspecified: Secondary | ICD-10-CM

## 2021-07-23 DIAGNOSIS — R5383 Other fatigue: Secondary | ICD-10-CM

## 2021-07-23 DIAGNOSIS — R4189 Other symptoms and signs involving cognitive functions and awareness: Secondary | ICD-10-CM

## 2021-07-23 DIAGNOSIS — I517 Cardiomegaly: Secondary | ICD-10-CM | POA: Diagnosis not present

## 2021-07-23 NOTE — ED Triage Notes (Addendum)
Pt c/o HTN (170/120 & 150/100 were at home readings) for the past two days, reports having brain fob,  and having SOB. Pt denies chest pain. Pt states he does take his BP medications but has been under a lot of stress.

## 2021-07-23 NOTE — Discharge Instructions (Addendum)
Please follow-up with your cardiologist regarding the abnormal findings on your EKG.  Recommend home COVID test to rule that out given your brain fog and fatigue.  Thank you for visiting urgent care today.

## 2021-07-23 NOTE — ED Provider Notes (Signed)
[facility]    CSN: [id number] Arrival date & time: 07/23/21  [id number]    HISTORY   Chief Complaint  Patient presents with   Hypertension   HPI Jeremiah [name] is a 56 y.o. male. Pt c/o HTN , 170/120 & 150/100 at home, for the past two days, reports having brain fog and having SOB.  Pt denies chest pain.  Pt states he does take his BP medications but has been under a lot of stress.  Pt was seen in urgent care in June 2022 with similar complaints.  Ultimately, patient was sent to the emergency room, troponins were negative, chest x-ray was normal and CT scan did not reveal pulmonary embolism.  He did have some T wave abnormalities on EKG but ultimately these were deemed insignificant, EKG today shows that these abnormal T waves have resolved.  Patient states that he has had 3 cardiac caths, first 1 at age 76, all have been normal.  Patient reports a history of heart disease in his family.  Blood pressure is reasonable today.  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  Patient states he has not had significant headache but has noticed that he had a little more sinus pressure recently along with difficulty with his reading glasses, patient has these with him today, is currently using readers that are rated at 2.25 magnification.  The history is provided by the patient.  Past Medical History:  Diagnosis Date   Alcohol dependence ([place])    Anxiety    Depression    Essential hypertension, benign    History of heavy alcohol consumption    HSV-2 (herpes simplex virus 2) infection    Hyperglycemia    Hyperlipidemia    Prostatitis    Reflux    Patient Active Problem List   Diagnosis Date Noted   Essential hypertension, benign    Depression    Hyperlipidemia    Alcohol dependence ([place])    Past Surgical History:  Procedure Laterality Date   CARDIAC CATHETERIZATION  2006   CARDIOVASCULAR  STRESS TEST  07/2011   CHOLECYSTECTOMY  1990   DOPPLER ECHOCARDIOGRAPHY  11/2005   LEFT HEART CATHETERIZATION WITH CORONARY ANGIOGRAM N/A 02/14/2014   Procedure: LEFT HEART CATHETERIZATION WITH CORONARY ANGIOGRAM;  Surgeon: [name], MD;  Location: [facility] CATH LAB;  Service: Cardiovascular;  Laterality: N/A;   ORIF FINGER FRACTURE  2009   rt ring   SHOULDER ARTHROSCOPY WITH SUBACROMIAL DECOMPRESSION, ROTATOR CUFF REPAIR AND BICEP TENDON REPAIR Right 07/09/2013   Procedure: RIGHT SHOULDER ARTHROSCOPY WITH SUBACROMIAL DECOMPRESSION, DEBRIDEMENT,  SUBSCAPULARIS TENDON REPAIR;  Surgeon: [name], MD;  Location: [facility];  Service: Orthopedics;  Laterality: Right;   SHOULDER SURGERY  1983   left    Home Medications    Prior to Admission medications   Medication Sig Start Date End Date Taking? Authorizing Provider  atorvastatin (LIPITOR) 20 MG tablet Take 1 tablet (20 mg total) by mouth daily. Patient taking differently: Take 40 mg by mouth daily. 06/07/17 11/19/20  Horald Pollen, MD  diazepam (VALIUM) 5 MG tablet Take by mouth.    [provider]  ibuprofen (ADVIL,MOTRIN) 600 MG tablet Take 1 every 8 hours as needed for back pain. 12/23/16   [name], PA-C  losartan-hydrochlorothiazide (HYZAAR) 100-12.5 MG tablet Take 1 tablet by mouth daily. 06/07/17   Horald Pollen,  MD  losartan-hydrochlorothiazide (HYZAAR) 100-25 MG tablet Take 1 tablet by mouth daily. 07/08/21   [provider]  nitroGLYCERIN (NITROSTAT) 0.4 MG SL tablet Place 1 tablet (0.4 mg total) under the tongue every 5 (five) minutes as needed for chest pain. Patient not taking: Reported on 08/27/2019 01/20/14   Lyda Jester M, PA-C  pantoprazole (PROTONIX) 40 MG tablet TAKE 1 TABLET BY MOUTH ONCE DAILY 11/24/17   Horald Pollen, MD  hydrochlorothiazide (HYDRODIURIL) 25 MG tablet Take 25 mg by mouth daily.  08/15/11  [provider]    Family History Family  History  Problem Relation Age of Onset   Cancer Mother        breast   Hyperlipidemia Mother    Hypertension Mother    Hyperlipidemia Father    Hypertension Father    Heart disease Father    Hyperlipidemia Sister    Hypertension Sister    Stroke Maternal Grandmother    Heart attack Maternal Grandmother    Heart attack Maternal Grandfather    Hyperlipidemia Sister    Hypertension Sister    Social History Social History   Tobacco Use   Smoking status: Former    Types: Cigarettes    Quit date: 07/02/1992    Years since quitting: 29.0   Smokeless tobacco: Never   Tobacco comments:    quit 10-15 yrs ago  Substance Use Topics   Alcohol use: Yes    Alcohol/week: 0.0 standard drinks    Comment: 6-12 pack nightly-6-7 beers,occ vodka,drinks every night   Drug use: No   Allergies   Patient has no known allergies.  Review of Systems Review of Systems Pertinent findings noted in history of present illness.   Physical Exam Triage Vital Signs ED Triage Vitals  Enc Vitals Group     BP 03/26/21 0827 (!) 147/82     Pulse Rate 03/26/21 0827 72     Resp 03/26/21 0827 18     Temp 03/26/21 0827 98.3 F (36.8 C)     Temp Source 03/26/21 0827 Oral     SpO2 03/26/21 0827 98 %     Weight --      Height --      Head Circumference --      Peak Flow --      Pain Score 03/26/21 0826 5     Pain Loc --      Pain Edu? --      Excl. in [place]? --   No data found.  Updated Vital Signs BP 132/90 (BP Location: Right Arm)    Pulse 79    Temp 98.4 F (36.9 C) (Oral)    Resp 18    SpO2 97%   Physical Exam Vitals and nursing note reviewed.  Constitutional:      General: He is not in acute distress.    Appearance: Normal appearance. He is not ill-appearing.  HENT:     Head: Normocephalic and atraumatic.     Salivary Glands: Right salivary gland is not diffusely enlarged or tender. Left salivary gland is not diffusely enlarged or tender.     Right Ear: Tympanic membrane, ear canal and  external ear normal. No drainage. No middle ear effusion. There is no impacted cerumen. Tympanic membrane is not erythematous or bulging.     Left Ear: Tympanic membrane, ear canal and external ear normal. No drainage.  No middle ear effusion. There is no impacted cerumen. Tympanic membrane is not erythematous or bulging.  Nose: Mucosal edema and congestion present. No nasal deformity, septal deviation or rhinorrhea.     Right Turbinates: Not enlarged, swollen or pale.     Left Turbinates: Not enlarged, swollen or pale.     Right Sinus: No maxillary sinus tenderness or frontal sinus tenderness.     Left Sinus: No maxillary sinus tenderness or frontal sinus tenderness.     Mouth/Throat:     Lips: Pink. No lesions.     Mouth: Mucous membranes are moist. No oral lesions.     Pharynx: Uvula midline. Pharyngeal swelling and posterior oropharyngeal erythema present. No oropharyngeal exudate or uvula swelling.     Tonsils: No tonsillar exudate. 0 on the right. 0 on the left.  Eyes:     General: Lids are normal.        Right eye: No discharge.        Left eye: No discharge.     Extraocular Movements: Extraocular movements intact.     Conjunctiva/sclera: Conjunctivae normal.     Right eye: Right conjunctiva is not injected.     Left eye: Left conjunctiva is not injected.  Neck:     Trachea: Trachea and phonation normal.  Cardiovascular:     Rate and Rhythm: Normal rate and regular rhythm.     Pulses: Normal pulses.     Heart sounds: Normal heart sounds. No murmur heard.   No friction rub. No gallop.  Pulmonary:     Effort: Pulmonary effort is normal. No accessory muscle usage, prolonged expiration or respiratory distress.     Breath sounds: Normal breath sounds. No stridor, decreased air movement or transmitted upper airway sounds. No decreased breath sounds, wheezing, rhonchi or rales.  Chest:     Chest wall: No tenderness.  Musculoskeletal:        General: Normal range of motion.      Cervical back: Normal range of motion and neck supple. Normal range of motion.  Lymphadenopathy:     Cervical: No cervical adenopathy.  Skin:    General: Skin is warm and dry.     Findings: No erythema or rash.  Neurological:     General: No focal deficit present.     Mental Status: He is alert and oriented to person, place, and time.  Psychiatric:        Mood and Affect: Mood normal.        Behavior: Behavior normal.    Visual Acuity Right Eye Distance:   Left Eye Distance:   Bilateral Distance:    Right Eye Near:   Left Eye Near:    Bilateral Near:     UC Couse / Diagnostics / Procedures:    EKG  Radiology No results found.  Procedures Procedures (including critical care time)  UC Diagnoses / Final Clinical Impressions(s)   I have reviewed the triage vital signs and the nursing notes.  Pertinent labs & imaging results that were available during my care of the patient were reviewed by me and considered in my medical decision making (see chart for details).    Final diagnoses:  Right atrial enlargement  Fatigue, unspecified type  Brain fog  Viral pharyngitis   Patient offered COVID test given erythema and swelling of throat, patient politely declined stating he had his rapid test in his car.  Patient advised to continue Flonase for symptoms.  Discussed possible underlying causes and risks of right atrial enlargement.  Patient states he does not currently have a cardiologist because the 1 he  was seeing has retired.  Patient states he plans to find a new one.  Patient provided with copies of his EKGs from today as well as from June 2022.  All questions addressed.  ED Prescriptions   None    PDMP not reviewed this encounter.  Pending results:  Labs Reviewed - No data to display  Medications Ordered in UC: Medications - No data to display  Disposition Upon Discharge:  Condition: stable for discharge home Home: take medications as prescribed; routine discharge  instructions as discussed; follow up as advised.  Patient presented with an acute illness with associated systemic symptoms and significant discomfort requiring urgent management. In my opinion, this is a condition that a prudent lay person (someone who possesses an average knowledge of health and medicine) may potentially expect to result in complications if not addressed urgently such as respiratory distress, impairment of bodily function or dysfunction of bodily organs.   Routine symptom specific, illness specific and/or disease specific instructions were discussed with the patient and/or caregiver at length.   As such, the patient has been evaluated and assessed, work-up was performed and treatment was provided in alignment with urgent care protocols and evidence based medicine.  Patient/parent/caregiver has been advised that the patient may require follow up for further testing and treatment if the symptoms continue in spite of treatment, as clinically indicated and appropriate.  If the patient was tested for COVID-19, Influenza and/or RSV, then the patient/parent/guardian was advised to isolate at home pending the results of his/her diagnostic coronavirus test and potentially longer if theyre positive. I have also advised pt that if his/her COVID-19 test returns positive, it's recommended to self-isolate for at least 10 days after symptoms first appeared AND until fever-free for 24 hours without fever reducer AND other symptoms have improved or resolved. Discussed self-isolation recommendations as well as instructions for household member/close contacts as per the [facility] and [place] DHHS, and also gave patient the [place] packet with this information.  Patient/parent/caregiver has been advised to return to the [facility] or PCP in 3-5 days if no better; to PCP or the Emergency Department if new signs and symptoms develop, or if the current signs or symptoms continue to change or worsen for further workup, evaluation  and treatment as clinically indicated and appropriate  The patient will follow up with their current PCP if and as advised. If the patient does not currently have a PCP we will assist them in obtaining one.   The patient may need specialty follow up if the symptoms continue, in spite of conservative treatment and management, for further workup, evaluation, consultation and treatment as clinically indicated and appropriate.   Patient/parent/caregiver verbalized understanding and agreement of plan as discussed.  All questions were addressed during visit.  Please see discharge instructions below for further details of plan.  Discharge Instructions:   Discharge Instructions      Please follow-up with your cardiologist regarding the abnormal findings on your EKG.  Recommend home COVID test to rule that out given your brain fog and fatigue.  Thank you for visiting urgent care today.    This office note has been dictated using Museum/gallery curator.  Unfortunately, and despite my best efforts, this method of dictation can sometimes lead to occasional typographical or grammatical errors.  I apologize in advance if this occurs.     Lynden Oxford Scales, PA-C 07/23/21 1101

## 2021-07-27 ENCOUNTER — Telehealth: Payer: Self-pay | Admitting: Emergency Medicine

## 2021-07-27 NOTE — Telephone Encounter (Signed)
Connected to [facility] 2.23.2023.  pt states he got dizzy a couple of days ago, also reports brain fog. bp was elevated, but finally started to drop. sx continue. bp currently 136/90. dizziness was worse 2 days ago.  Advised to go to ED.

## 2021-07-28 NOTE — Telephone Encounter (Signed)
I agree. Thanks.

## 2021-08-31 ENCOUNTER — Encounter: Payer: Self-pay | Admitting: Cardiovascular Disease

## 2021-08-31 ENCOUNTER — Ambulatory Visit (INDEPENDENT_AMBULATORY_CARE_PROVIDER_SITE_OTHER): Payer: 59 | Admitting: Cardiovascular Disease

## 2021-08-31 DIAGNOSIS — Z8249 Family history of ischemic heart disease and other diseases of the circulatory system: Secondary | ICD-10-CM

## 2021-08-31 DIAGNOSIS — I10 Essential (primary) hypertension: Secondary | ICD-10-CM

## 2021-08-31 DIAGNOSIS — R9431 Abnormal electrocardiogram [ECG] [EKG]: Secondary | ICD-10-CM | POA: Diagnosis not present

## 2021-08-31 DIAGNOSIS — E7800 Pure hypercholesterolemia, unspecified: Secondary | ICD-10-CM | POA: Diagnosis not present

## 2021-08-31 NOTE — Progress Notes (Signed)
? ?Cardiology Office Note   ? ?Date:  09/04/2021  ? ?ID:  Jeremiah [name], DOB 02/02/1966, MRN [id number] ? ?PCP:  Jeremiah Quint, MD  ?Cardiologist:  Jeremiah Guadalajara, MD  ? ?Reestablishment of cardiology care ? ?History of Present Illness:  ?Jeremiah [name] is a 57 y.o. male who is remote patient of Jeremiah [name] and Jeremiah [name].  Following Jeremiah [name] retirement, he had been seen by me in 2015.  He has a very strong family history of premature coronary artery disease with his father suffering his first myocardial infarction at age 1 and initial CABG surgery at age 92 with redo CABG at age 65.  In 2006 Jeremiah [name] had undergone cardiac catheterization by Jeremiah [name] which revealed normal coronary arteries.  Due to recurrent chest pain symptomatology in 2015 repeat catheterization was performed by me which showed normal LV function and normal coronary arteries.   ? ?The patient has not seen me since 2015.  He travels weekly and spends time in [place], [place] as well as [place], [place].  He exercises 7 days a week.  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  He recently has noticed blood pressure lability which prompted him to be evaluated in urgent care on July 23, 2021.  At that time he was under a lot of stress and reported blood pressures at home were 1 50-1 70 systolic with diastolics in 100.  He previously had been evaluated at urgent care in June 2022 with similar complaints.  Troponins were negative.  On his most recent ER reportedly previous abnormal T waves had normalized.  In the emergency room, he was told that there was slight abnormality to his ECG which may represent right sided enlargement.  On his recent trip to [place], he did see a cardiologist there who felt his ECG was normal.  He is planning to leave for [facility] tomorrow and scheduled his appointment to see me for my assessment to see if his ECG is abnormal and does he have any concern for enlarged heart.  He denies  any chest pain.  He admits that he is under increased stress.  He is always on the go.  He presents for evaluation. ? ? ?Past Medical History:  ?Diagnosis Date  ? Alcohol dependence ([facility])   ? Anxiety   ? Depression   ? Essential hypertension, benign   ? History of heavy alcohol consumption   ? HSV-2 (herpes simplex virus 2) infection   ? Hyperglycemia   ? Hyperlipidemia   ? Prostatitis   ? Reflux   ? ? ?Past Surgical History:  ?Procedure Laterality Date  ? CARDIAC CATHETERIZATION  2006  ? CARDIOVASCULAR STRESS TEST  07/2011  ? CHOLECYSTECTOMY  1990  ? DOPPLER ECHOCARDIOGRAPHY  11/2005  ? LEFT HEART CATHETERIZATION WITH CORONARY ANGIOGRAM N/A 02/14/2014  ? Procedure: LEFT HEART CATHETERIZATION WITH CORONARY ANGIOGRAM;  Surgeon: Jeremiah Bihari, MD;  Location: [facility] CATH LAB;  Service: Cardiovascular;  Laterality: N/A;  ? ORIF FINGER FRACTURE  2009  ? rt ring  ? SHOULDER ARTHROSCOPY WITH SUBACROMIAL DECOMPRESSION, ROTATOR CUFF REPAIR AND BICEP TENDON REPAIR Right 07/09/2013  ? Procedure: RIGHT SHOULDER ARTHROSCOPY WITH SUBACROMIAL DECOMPRESSION, DEBRIDEMENT,  SUBSCAPULARIS TENDON REPAIR;  Surgeon: Jeremiah Forster., MD;  Location: [facility];  Service: Orthopedics;  Laterality: Right;  ? SHOULDER SURGERY  1983  ? left  ? ? ?Current Medications: ?Outpatient Medications Prior to Visit  ?Medication Sig Dispense Refill  ?  atorvastatin (LIPITOR) 20 MG tablet Take 1 tablet (20 mg total) by mouth daily. (Patient taking differently: Take 40 mg by mouth daily.) 90 tablet 3  ? diazepam (VALIUM) 5 MG tablet Take by mouth.    ? ibuprofen (ADVIL,MOTRIN) 600 MG tablet Take 1 every 8 hours as needed for back pain. 30 tablet 1  ? losartan-hydrochlorothiazide (HYZAAR) 100-25 MG tablet Take 1 tablet by mouth daily.    ? losartan-hydrochlorothiazide (HYZAAR) 100-25 MG tablet Take 1 tablet by mouth daily.    ? metoprolol succinate (TOPROL-XL) 25 MG 24 hr tablet Take 25 mg by mouth daily.    ? pantoprazole (PROTONIX) 40 MG tablet  TAKE 1 TABLET BY MOUTH ONCE DAILY 60 tablet 1  ? losartan-hydrochlorothiazide (HYZAAR) 100-12.5 MG tablet Take 1 tablet by mouth daily. (Patient not taking: Reported on 08/31/2021) 90 tablet 3  ? nitroGLYCERIN (NITROSTAT) 0.4 MG SL tablet Place 1 tablet (0.4 mg total) under the tongue every 5 (five) minutes as needed for chest pain. (Patient not taking: Reported on 08/31/2021) 25 tablet 3  ? ?No facility-administered medications prior to visit.  ?  ? ?Allergies:   Patient has no known allergies.  ? ?Social History  ? ?Socioeconomic History  ? Marital status: Single  ?  Spouse name: Not on file  ? Number of children: Not on file  ? Years of education: Not on file  ? Highest education level: Not on file  ?Occupational History  ? Not on file  ?Tobacco Use  ? Smoking status: Former  ?  Types: Cigarettes  ?  Quit date: 07/02/1992  ?  Years since quitting: 29.1  ? Smokeless tobacco: Never  ? Tobacco comments:  ?  quit 10-15 yrs ago  ?Substance and Sexual Activity  ? Alcohol use: Yes  ?  Alcohol/week: 0.0 standard drinks  ?  Comment: 6-12 pack nightly-6-7 beers,occ vodka,drinks every night  ? Drug use: No  ? Sexual activity: Yes  ?Other Topics Concern  ? Not on file  ?Social History Narrative  ? Not on file  ? ?Social Determinants of Health  ? ?Financial Resource Strain: Not on file  ?Food Insecurity: Not on file  ?Transportation Needs: Not on file  ?Physical Activity: Not on file  ?Stress: Not on file  ?Social Connections: Not on file  ?  ?Socially he is single.  He has no children.  He lives alone.  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  He drinks vodka several days per week.  He works out 6 to 7 days/week both daily cardio as well as resistance training for 90 minutes a day. ? ?Family History:  The patient's family history includes Cancer in his mother; Heart attack in his maternal grandfather and maternal grandmother; Heart disease in his father; Hyperlipidemia in his father, mother, sister, and sister;  Hypertension in his father, mother, sister, and sister; Stroke in his maternal grandmother.  ? ?His mother died at age 58 with heart and cancer issues.  Father is still living.  He had  3 sisters, 2 of which have hypertension and 1 unfortunately is deceased ? ?ROS ?General: Negative; No fevers, chills, or night sweats; periods of increased stress ?HEENT: Negative; No changes in vision or hearing, sinus congestion, difficulty swallowing ?Pulmonary: Negative; No cough, wheezing, shortness of breath, hemoptysis ?Cardiovascular: Negative; No chest pain, presyncope, syncope, palpitations ?GI: Negative; No nausea, vomiting, diarrhea, or abdominal pain ?GU: Negative; No dysuria, hematuria, or difficulty voiding ?Musculoskeletal: Negative; no myalgias, joint pain, or weakness ?Hematologic/Oncology:  Negative; no easy bruising, bleeding ?Endocrine: Negative; no heat/cold intolerance; no diabetes ?Neuro: Negative; no changes in balance, headaches ?Skin: Negative; No rashes or skin lesions ?Psychiatric: Negative; No behavioral problems, depression ?Sleep: Negative; No snoring, daytime sleepiness, hypersomnolence, bruxism, restless legs, hypnogognic hallucinations, no cataplexy ?Other comprehensive 14 point system review is negative. ? ? ?PHYSICAL EXAM:   ?VS:  BP 124/84   Pulse 67   Ht 5\' 9"  (1.753 m)   Wt 176 lb 12.8 oz (80.2 kg)   SpO2 98%   BMI 26.11 kg/m?    ? ?Repeat blood pressure by me was 120/82 ? ?Wt Readings from Last 3 Encounters:  ?08/31/21 176 lb 12.8 oz (80.2 kg)  ?11/19/20 185 lb (83.9 kg)  ?08/27/19 185 lb (83.9 kg)  ?  ?General: Alert, oriented, no distress.  ?Skin: normal turgor, no rashes, warm and dry ?HEENT: Normocephalic, atraumatic. Pupils equal round and reactive to light; sclera anicteric; extraocular muscles intact;  ?Nose without nasal septal hypertrophy ?Mouth/Parynx benign; Mallinpatti scale 3 ?Neck: No JVD, no carotid bruits; normal carotid upstroke ?Lungs: clear to ausculatation and  percussion; no wheezing or rales ?Chest wall: without tenderness to palpitation ?Heart: PMI not displaced, RRR, s1 s2 normal, 1/6 systolic murmur, no diastolic murmur, no rubs, gallops, thrills, or heaves ?Abdomen: soft, n

## 2021-08-31 NOTE — Patient Instructions (Addendum)
Medication Instructions:  ?No changes ?*If you need a refill on your cardiac medications before your next appointment, please call your pharmacy* ? ? ?Lab Work: ?Not needed ? ? ? ?Testing/Procedures: ?WILL BE SCHEDULE at [address]  [address] ?Your physician has requested that you have an echocardiogram. Echocardiography is a painless test that uses sound waves to create images of your heart. It provides your doctor with information about the size and shape of your heart and how well your heart?s chambers and valves are working. This procedure takes approximately one hour. There are no restrictions for this procedure. ? ? ? ?Follow-Up: ?At [facility], you and your health needs are our priority.  As part of our continuing mission to provide you with exceptional heart care, we have created designated Provider Care Teams.  These Care Teams include your primary Cardiologist (physician) and Advanced Practice Providers (APPs -  Physician Assistants and Nurse Practitioners) who all work together to provide you with the care you need, when you need it. ? ?We recommend signing up for the patient portal called "MyChart".  Sign up information is provided on this After Visit Summary.  MyChart is used to connect with patients for Virtual Visits (Telemedicine).  Patients are able to view lab/test results, encounter notes, upcoming appointments, etc.  Non-urgent messages can be sent to your provider as well.   ?To learn more about what you can do with MyChart, go to [email or web address].   ? ?Your next appointment:   ?  AS NEEDED  if normal echo  ? ?The format for your next appointment:   ?In Person ? ?Provider:   ?Nicki Guadalajara, MD { ? ?If primary card or EP is not listed click here to update    : ?

## 2021-09-04 ENCOUNTER — Encounter: Payer: Self-pay | Admitting: Cardiovascular Disease

## 2021-09-14 ENCOUNTER — Ambulatory Visit (HOSPITAL_COMMUNITY): Payer: 59 | Attending: Cardiology

## 2021-09-14 DIAGNOSIS — R9431 Abnormal electrocardiogram [ECG] [EKG]: Secondary | ICD-10-CM | POA: Insufficient documentation

## 2021-09-14 DIAGNOSIS — I10 Essential (primary) hypertension: Secondary | ICD-10-CM | POA: Insufficient documentation

## 2021-09-14 LAB — ECHOCARDIOGRAM COMPLETE
Area-P 1/2: 2.72 cm2
S' Lateral: 3.1 cm

## 2021-09-20 ENCOUNTER — Encounter: Payer: Self-pay | Admitting: *Deleted

## 2022-11-24 ENCOUNTER — Other Ambulatory Visit
Admission: RE | Admit: 2022-11-24 | Discharge: 2022-11-24 | Disposition: A | Discharge status: Home / Self Care | Payer: No Typology Code available for payment source | Source: Ambulatory Visit | Attending: Adult Health | Admitting: Adult Health

## 2022-11-24 DIAGNOSIS — E538 Deficiency of other specified B group vitamins: Secondary | ICD-10-CM | POA: Insufficient documentation

## 2022-11-24 DIAGNOSIS — E559 Vitamin D deficiency, unspecified: Secondary | ICD-10-CM | POA: Insufficient documentation

## 2022-11-24 DIAGNOSIS — E089 Diabetes mellitus due to underlying condition without complications: Secondary | ICD-10-CM | POA: Insufficient documentation

## 2022-11-24 DIAGNOSIS — D649 Anemia, unspecified: Secondary | ICD-10-CM | POA: Insufficient documentation

## 2022-11-24 DIAGNOSIS — E785 Hyperlipidemia, unspecified: Secondary | ICD-10-CM | POA: Insufficient documentation

## 2022-11-24 DIAGNOSIS — N390 Urinary tract infection, site not specified: Secondary | ICD-10-CM | POA: Insufficient documentation

## 2022-11-24 DIAGNOSIS — Z125 Encounter for screening for malignant neoplasm of prostate: Secondary | ICD-10-CM | POA: Insufficient documentation

## 2022-11-24 DIAGNOSIS — R945 Abnormal results of liver function studies: Secondary | ICD-10-CM | POA: Insufficient documentation

## 2022-11-24 DIAGNOSIS — R946 Abnormal results of thyroid function studies: Secondary | ICD-10-CM | POA: Insufficient documentation

## 2022-11-24 LAB — URINALYSIS REFLEX TO CULTURE
Blood,UA: NEGATIVE
Glucose,UA: NEGATIVE
Ketones, UA: NEGATIVE
Leuk Esterase,UA: NEGATIVE
Nitrite,UA: NEGATIVE
Protein,UA: NEGATIVE
Specific Gravity,UA: 1.016 (ref 1.002–1.030)
pH,UA: 6 (ref 5.0–8.0)

## 2022-11-24 LAB — HEMOGLOBIN A1C: Hemoglobin A1C: 5.5 %

## 2022-11-24 LAB — COMPREHENSIVE METABOLIC PANEL
ALT: 25 U/L (ref 0–50)
AST: 34 U/L (ref 0–50)
Albumin: 4.9 g/dL (ref 3.5–5.2)
Alk Phos: 77 U/L (ref 40–130)
Anion Gap: 11 (ref 7–16)
Bilirubin,Total: 0.5 mg/dL (ref 0.0–1.2)
CO2: 30 mmol/L — ABNORMAL HIGH (ref 20–28)
Calcium: 10.4 mg/dL — ABNORMAL HIGH (ref 8.6–10.2)
Chloride: 103 mmol/L (ref 96–108)
Creatinine: 0.93 mg/dL (ref 0.67–1.17)
Glucose: 115 mg/dL — ABNORMAL HIGH (ref 60–99)
Lab: 16 mg/dL (ref 6–20)
Potassium: 4.3 mmol/L (ref 3.3–5.1)
Sodium: 144 mmol/L (ref 133–145)
Total Protein: 7 g/dL (ref 6.3–7.7)
eGFR BY CREAT: 95 *

## 2022-11-24 LAB — LIPASE: Lipase: 75 U/L — ABNORMAL HIGH (ref 13–60)

## 2022-11-24 LAB — BILIRUBIN, DIRECT: Bilirubin,Direct: <0.2 mg/dL (ref 0.0–0.3)

## 2022-11-24 LAB — CBC
Hematocrit: 45 % (ref 37–52)
Hemoglobin: 15.3 g/dL (ref 12.0–17.0)
MCV: 93 fL (ref 75–100)
Platelets: 185 10*3/uL (ref 150–450)
RBC: 4.8 MIL/uL (ref 4.0–6.0)
RDW: 12.8 % (ref 0.0–15.0)
WBC: 4.5 10*3/uL (ref 3.5–11.0)

## 2022-11-24 LAB — TIBC
Iron: 68 ug/dL (ref 45–170)
TIBC: 350 ug/dL (ref 250–450)
Transferrin Saturation: 19 % — ABNORMAL LOW (ref 20–55)

## 2022-11-24 LAB — AMYLASE: Amylase: 124 U/L — ABNORMAL HIGH (ref 28–100)

## 2022-11-24 LAB — VITAMIN B12: Vitamin B12: 408 pg/mL (ref 232–1245)

## 2022-11-24 LAB — TSH: TSH: 2.05 u[IU]/mL (ref 0.27–4.20)

## 2022-11-24 LAB — URIC ACID: Urate: 7.6 mg/dL (ref 3.9–9.0)

## 2022-11-24 LAB — VITAMIN D: 25-OH Vit Total: 36 ng/mL (ref 30–60)

## 2022-11-24 LAB — PSA (EFF.4-2010): PSA (eff. 4-2010): 1.78 ng/mL (ref 0.00–4.00)

## 2022-11-24 LAB — FERRITIN: Ferritin: 452 ng/mL — ABNORMAL HIGH (ref 20–250)

## 2023-02-01 ENCOUNTER — Other Ambulatory Visit: Payer: Self-pay | Admitting: Emergency Medicine

## 2023-02-01 DIAGNOSIS — R110 Nausea: Secondary | ICD-10-CM

## 2023-02-01 DIAGNOSIS — R1084 Generalized abdominal pain: Secondary | ICD-10-CM

## 2023-02-06 ENCOUNTER — Ambulatory Visit
Admission: RE | Admit: 2023-02-06 | Discharge: 2023-02-06 | Disposition: A | Discharge status: Home / Self Care | Payer: 59 | Source: Ambulatory Visit | Attending: Emergency Medicine | Admitting: Emergency Medicine

## 2023-02-06 DIAGNOSIS — R110 Nausea: Secondary | ICD-10-CM

## 2023-02-06 DIAGNOSIS — R1084 Generalized abdominal pain: Secondary | ICD-10-CM

## 2023-11-21 LAB — UNMAPPED LAB RESULTS
Basophil # (HT): 0 10 3/uL (ref 0.0–0.2)
Basophil % (HT): 1 % (ref 0–2)
Eosinophil # (HT): 0.1 10 3/uL (ref 0.0–0.5)
Eosinophil % (HT): 2 % (ref 0–7)
Hematocrit (HT): 41 % (ref 40–52)
Hemoglobin (HGB) (HT): 14.3 g/dL (ref 13.0–17.5)
Lymphocyte # (HT): 1.3 10 3/uL (ref 0.9–3.8)
Lymphocyte % (HT): 33 % (ref 17–44)
MCHC (HT): 35 g/dL (ref 32.0–36.0)
MCV (HT): 90.5 fL (ref 81.0–99.0)
Mean Corpuscular Hemoglobin (MCH) (HT): 31.7 pg (ref 26.0–34.0)
Monocyte # (HT): 0.4 10 3/uL (ref 0.2–1.0)
Monocyte % (HT): 9 % (ref 4–12)
Neutrophil # (HT): 2.3 10 3/uL (ref 1.5–7.7)
Platelets (HT): 168 10 3/uL (ref 150–450)
RBC (HT): 4.51 10 6/uL (ref 4.20–5.90)
RDW (HT): 12.8 % (ref 11.5–15.0)
Seg Neut % (HT): 56 % (ref 40–75)
WBC (HT): 4.1 10 3/uL (ref 4.0–10.8)

## 2024-03-25 ENCOUNTER — Ambulatory Visit: Payer: Self-pay | Attending: Emergency Medicine | Admitting: Emergency Medicine

## 2024-03-25 ENCOUNTER — Other Ambulatory Visit: Payer: Self-pay

## 2024-03-25 VITALS — BP 130/90 | HR 95 | Temp 96.8°F | Resp 18

## 2024-03-25 DIAGNOSIS — R300 Dysuria: Secondary | ICD-10-CM | POA: Insufficient documentation

## 2024-03-25 DIAGNOSIS — M549 Dorsalgia, unspecified: Secondary | ICD-10-CM | POA: Insufficient documentation

## 2024-03-25 LAB — POCT URINALYSIS DIPSTICK
Bilirubin,Ur: NEGATIVE
Blood,UA POCT: NEGATIVE
Glucose,UA POCT: NORMAL mg/dL
Ketones,UA POCT: NEGATIVE mg/dL
Lot #: 82722002
Nitrite,UA POCT: NEGATIVE
PH,UA POCT: 7 (ref 5–8)
Specific gravity,UA POCT: 1.015 (ref 1.002–1.030)
Urobilinogen,UA: NORMAL mg/dL

## 2024-03-25 MED ORDER — LIDOCAINE 5 % EX PTCH *I*
1.0000 | MEDICATED_PATCH | CUTANEOUS | 0 refills | Status: AC
Start: 2024-03-25 — End: ?

## 2024-03-25 MED ORDER — CEPHALEXIN 500 MG PO CAPS *I*
500.0000 mg | ORAL_CAPSULE | Freq: Four times a day (QID) | ORAL | 0 refills | Status: AC
Start: 2024-03-25 — End: 2024-04-01

## 2024-03-25 NOTE — Patient Instructions (Signed)
 Like we discussed, we are treating you for a UTI based on your symptoms.Your urine culture and STI testing are pending.For your low back, you can continue over the counter medications. You can try the numbing patches on the area as well.If your pain persists, please follow up with your PCP when you return home to [place] . We have obtained STD testing while in the office today and it will be sent to the lab for final result. Results take about 3-4 days.  You will only be called for positive results. Please use condoms with every sexual encounter to prevent any STDs.  Speak with your partner(s) about being checked for STDs especially if you are having symptoms. Please abstain from sex until your symptoms have resolved. A positive result for STD will need to be reported to the health department.

## 2024-03-25 NOTE — UC Provider Note (Signed)
 Chief Complaint:  Chief Complaint Patient presents with  Back Pain   Lower back pain for last couple weeks. Denies injury/trauma. Pt endorsing dysuria. No pain meds taken. History provided by: patientHistory limited by: n/aLanguage interpreter used: noHPI:58 y.o. male w hx as noted in chart presents to Urgent Care for dysuria, bilateral low back pain, scrotal aching.Symptoms endorsed:dysuria and bilateral flank pain. Back pain x 2-3 weeks, dysuria, x 1 week or so, comes and goes.Denies unilateral testicular pain, states the entire genital area feels achy from time to time, not constant.Low back pain starts midline, radiates to both sides. No LE weakness. Does endorse LLE numbness that comes and goes. No known exposure to STI, same partner x 6 months but would test for STI if recommended.Denies:hematuria, malodorous urine, and feverPatient concerned about STIs: NoRecent urological procedures: NoVITALS:BP 130/90   Pulse 95   Temp 36 C (96.8 F) (Temporal)   Resp 18   SpO2 99% ROS: see HPI abovePhysical Exam:Appearance: Well appearing, no acute distress CV: normal ratePulm: no acute respiratory distressAbdomen: Soft, nondistended, normoactive bowel sounds, Tenderness to palpation: no, CVA tenderness: noSkin: no pallor or rash MUSK: No midline cervical, thoracic, or lumbar spinal tenderness to palpationGU: Declined MEDICAL DECISION MAKING:Assessment / Plan: 58 y.o. male w hx as noted in chart presents to UC with dysuria, back pain.Differential Diagnosis: Acute cystitis, pyelonephritis, overactive bladder, interstitial cystitis, STI, DDDOrders / Results: Orders Placed This Encounter  Aerobic bacterial urine culture  Chlamydia NAAT (PCR)  N. gonorrhoeae NAAT (PCR)  Trichomonas NAAT  Hepatitis B & C profile  HIV 1&2 antigen/antibody  Syphilis Screen w Rfx to RPR and TPPA  POCT urinalysis dipstick   cephalexin (KEFLEX) 500 mg capsule  lidocaine (LIDODERM) 5 % patch Results for orders placed or performed in visit on 03/25/24 POCT urinalysis dipstick Result Value Ref Range  Specific gravity,UA POCT 1.015 1.002 - 1.030  PH,UA POCT 7.0 5 - 8  Leuk Esterase,UA POCT Trace (!) Negative, Test Not Performed  Nitrite,UA POCT Negative Negative, Test Not Performed  Protein,UA POCT Trace (!) Negative, Test Not Performed mg/dL  Glucose,UA POCT Normal Normal mg/dL  Ketones,UA POCT Negative Negative mg/dL  Urobilinogen,UA Normal Less than 1 mg/dL  Bilirubin,Ur Negative Negative, Test Not Performed  Blood,UA POCT Negative Negative, Test Not Performed  Exp date 09/26/24   Lot # [id number]  Diagnosis and Disposition:1. Dysuria  2. Back pain, unspecified back location, unspecified back pain laterality, unspecified chronicity  UA with trace leukocyte esterase.We discussed given his symptoms, we will initiate tx for UTI.STI testing ordered and pending.We will only contact with positive results. We discussed follow up with his PCP when he returns home to [place] . Given the patient's reassuring vital signs and overall well appearance, the patient can be managed safely at home. Patient advised to call PCP or return to urgent care for any worsening or concerning symptoms as reviewed and provided on AVS.Please follow up with your physician as below:No follow-ups on file.

## 2024-03-26 LAB — CHLAMYDIA NAAT (PCR): Chlamydia NAAT (PCR): NEGATIVE

## 2024-03-26 LAB — AEROBIC BACTERIAL URINE CULTURE: Aerobic bacterial urine culture: 0

## 2024-03-26 LAB — HEPATITIS B & C PROF
HBV Core Ab: NEGATIVE
HBV S Ab Quant: 0 m[IU]/mL
HBV S Ab: NEGATIVE
HBV S Ag: NEGATIVE
Hep C Ab: NEGATIVE

## 2024-03-26 LAB — TRICHOMONAS NAAT: Trichomonas NAAT: NEGATIVE

## 2024-03-26 LAB — HIV-1/2  ANTIGEN/ANTIBODY SCREEN WITH CONFIRMATION: HIV 1&2 ANTIGEN/ANTIBODY: NONREACTIVE

## 2024-03-26 LAB — N. GONORRHOEAE NAAT (PCR): N. gonorrhoeae NAAT (PCR): NEGATIVE

## 2024-03-26 LAB — SYPHILIS SCREEN
Syphilis Screen: NEGATIVE
Syphilis Status: NONREACTIVE

## 2024-03-27 ENCOUNTER — Telehealth: Payer: Self-pay

## 2024-03-27 NOTE — Telephone Encounter (Signed)
 Spoke to patient, informed of negative results from recent visit.  Patient still having symptoms.  States he feels generally unwell, very fatigued.  He is in [place]  has an appointment with his PCP in [place]  tomorrow.  Encouraged him to keep the appointment to address these concerns.  All questions answered.
